# Patient Record
Sex: Female | Born: 1950
Health system: Southern US, Community
[De-identification: ages and names within clinical notes are randomized; demographics above are authoritative.]

## PROBLEM LIST (undated history)

## (undated) DIAGNOSIS — E119 Type 2 diabetes mellitus without complications: Secondary | ICD-10-CM

## (undated) DIAGNOSIS — I10 Essential (primary) hypertension: Secondary | ICD-10-CM

## (undated) HISTORY — PX: SKIN CANCER EXCISION: SHX779

## (undated) HISTORY — PX: HERNIA REPAIR: SHX51

## (undated) HISTORY — PX: ABDOMINAL HYSTERECTOMY: SHX81

## (undated) HISTORY — PX: BACK SURGERY: SHX140

## (undated) HISTORY — PX: JOINT REPLACEMENT: SHX530

---

## 1998-04-04 ENCOUNTER — Other Ambulatory Visit: Admission: RE | Admit: 1998-04-04 | Discharge: 1998-04-04 | Payer: Self-pay | Admitting: Gynecology

## 1998-05-07 ENCOUNTER — Other Ambulatory Visit: Admission: RE | Admit: 1998-05-07 | Discharge: 1998-05-07 | Payer: Self-pay | Admitting: Gynecology

## 1998-09-11 ENCOUNTER — Inpatient Hospital Stay (HOSPITAL_COMMUNITY): Admission: EM | Admit: 1998-09-11 | Discharge: 1998-09-12 | Payer: Self-pay | Admitting: Emergency Medicine

## 1998-09-12 ENCOUNTER — Encounter: Payer: Self-pay | Admitting: Internal Medicine

## 1999-11-25 ENCOUNTER — Encounter: Payer: Self-pay | Admitting: Orthopedic Surgery

## 1999-11-25 ENCOUNTER — Encounter (INDEPENDENT_AMBULATORY_CARE_PROVIDER_SITE_OTHER): Payer: Self-pay | Admitting: Specialist

## 1999-11-25 ENCOUNTER — Observation Stay (HOSPITAL_COMMUNITY): Admission: RE | Admit: 1999-11-25 | Discharge: 1999-11-26 | Payer: Self-pay | Admitting: Orthopedic Surgery

## 2000-05-06 ENCOUNTER — Ambulatory Visit (HOSPITAL_COMMUNITY): Admission: RE | Admit: 2000-05-06 | Discharge: 2000-05-06 | Payer: Self-pay | Admitting: Gastroenterology

## 2002-09-12 ENCOUNTER — Encounter: Payer: Self-pay | Admitting: Emergency Medicine

## 2002-09-12 ENCOUNTER — Inpatient Hospital Stay (HOSPITAL_COMMUNITY): Admission: EM | Admit: 2002-09-12 | Discharge: 2002-09-14 | Payer: Self-pay | Admitting: Emergency Medicine

## 2002-10-05 ENCOUNTER — Encounter: Payer: Self-pay | Admitting: *Deleted

## 2002-10-05 ENCOUNTER — Ambulatory Visit (HOSPITAL_COMMUNITY): Admission: RE | Admit: 2002-10-05 | Discharge: 2002-10-05 | Payer: Self-pay | Admitting: *Deleted

## 2002-12-11 ENCOUNTER — Observation Stay (HOSPITAL_COMMUNITY): Admission: RE | Admit: 2002-12-11 | Discharge: 2002-12-12 | Payer: Self-pay | Admitting: General Surgery

## 2002-12-11 ENCOUNTER — Encounter: Payer: Self-pay | Admitting: General Surgery

## 2002-12-11 ENCOUNTER — Encounter (INDEPENDENT_AMBULATORY_CARE_PROVIDER_SITE_OTHER): Payer: Self-pay | Admitting: Specialist

## 2005-05-14 ENCOUNTER — Other Ambulatory Visit: Admission: RE | Admit: 2005-05-14 | Discharge: 2005-05-14 | Payer: Self-pay | Admitting: Obstetrics and Gynecology

## 2005-05-14 ENCOUNTER — Ambulatory Visit (HOSPITAL_COMMUNITY): Admission: RE | Admit: 2005-05-14 | Discharge: 2005-05-14 | Payer: Self-pay | Admitting: Obstetrics and Gynecology

## 2005-07-12 ENCOUNTER — Observation Stay (HOSPITAL_COMMUNITY): Admission: RE | Admit: 2005-07-12 | Discharge: 2005-07-14 | Payer: Self-pay | Admitting: General Surgery

## 2005-11-16 ENCOUNTER — Encounter: Admission: RE | Admit: 2005-11-16 | Discharge: 2005-11-16 | Payer: Self-pay | Admitting: Family Medicine

## 2005-11-19 ENCOUNTER — Encounter: Admission: RE | Admit: 2005-11-19 | Discharge: 2005-11-19 | Payer: Self-pay | Admitting: General Surgery

## 2010-05-24 ENCOUNTER — Ambulatory Visit (HOSPITAL_COMMUNITY): Admission: RE | Admit: 2010-05-24 | Discharge: 2010-05-24 | Payer: Self-pay | Admitting: Emergency Medicine

## 2010-07-08 ENCOUNTER — Encounter: Admission: RE | Admit: 2010-07-08 | Discharge: 2010-07-08 | Payer: Self-pay

## 2011-01-12 ENCOUNTER — Inpatient Hospital Stay (HOSPITAL_COMMUNITY)
Admission: EM | Admit: 2011-01-12 | Discharge: 2011-01-13 | Payer: Self-pay | Source: Home / Self Care | Attending: Internal Medicine | Admitting: Internal Medicine

## 2011-01-13 LAB — DIFFERENTIAL
Basophils Absolute: 0 10*3/uL (ref 0.0–0.1)
Basophils Relative: 0 % (ref 0–1)
Eosinophils Absolute: 0.2 10*3/uL (ref 0.0–0.7)
Eosinophils Relative: 3 % (ref 0–5)
Lymphocytes Relative: 31 % (ref 12–46)
Lymphs Abs: 2.3 10*3/uL (ref 0.7–4.0)
Monocytes Absolute: 0.6 10*3/uL (ref 0.1–1.0)
Monocytes Relative: 8 % (ref 3–12)
Neutro Abs: 4.3 10*3/uL (ref 1.7–7.7)
Neutrophils Relative %: 58 % (ref 43–77)

## 2011-01-13 LAB — COMPREHENSIVE METABOLIC PANEL
ALT: 21 U/L (ref 0–35)
ALT: 17 U/L (ref 0–35)
AST: 19 U/L (ref 0–37)
AST: 19 U/L (ref 0–37)
Albumin: 3.5 g/dL (ref 3.5–5.2)
Albumin: 3.1 g/dL — ABNORMAL LOW (ref 3.5–5.2)
Alkaline Phosphatase: 67 U/L (ref 39–117)
Alkaline Phosphatase: 59 U/L (ref 39–117)
BUN: 12 mg/dL (ref 6–23)
BUN: 12 mg/dL (ref 6–23)
CO2: 26 mEq/L (ref 19–32)
CO2: 25 mEq/L (ref 19–32)
Calcium: 9.3 mg/dL (ref 8.4–10.5)
Calcium: 8.5 mg/dL (ref 8.4–10.5)
Chloride: 105 mEq/L (ref 96–112)
Chloride: 106 mEq/L (ref 96–112)
Creatinine, Ser: 0.77 mg/dL (ref 0.4–1.2)
Creatinine, Ser: 0.84 mg/dL (ref 0.4–1.2)
GFR calc Af Amer: >60 mL/min (ref >60)
GFR calc Af Amer: >60 mL/min (ref >60)
GFR calc non Af Amer: >60 mL/min (ref >60)
GFR calc non Af Amer: >60 mL/min (ref >60)
Glucose, Bld: 125 mg/dL — ABNORMAL HIGH (ref 70–99)
Glucose, Bld: 147 mg/dL — ABNORMAL HIGH (ref 70–99)
Potassium: 3.9 mEq/L (ref 3.5–5.1)
Potassium: 3.9 mEq/L (ref 3.5–5.1)
Sodium: 141 mEq/L (ref 135–145)
Sodium: 140 mEq/L (ref 135–145)
Total Bilirubin: 0.5 mg/dL (ref 0.3–1.2)
Total Bilirubin: 0.4 mg/dL (ref 0.3–1.2)
Total Protein: 6.3 g/dL (ref 6.0–8.3)
Total Protein: 5.4 g/dL — ABNORMAL LOW (ref 6.0–8.3)

## 2011-01-13 LAB — LIPID PANEL
Cholesterol: 183 mg/dL (ref 0–200)
HDL: 40 mg/dL (ref >39)
LDL Cholesterol: 98 mg/dL (ref 0–99)
Total CHOL/HDL Ratio: 4.6 RATIO
Triglycerides: 225 mg/dL — ABNORMAL HIGH (ref <150)
VLDL: 45 mg/dL — ABNORMAL HIGH (ref 0–40)

## 2011-01-13 LAB — PROTIME-INR
INR: 0.91 (ref 0.00–1.49)
INR: 1 (ref 0.00–1.49)
Prothrombin Time: 12.5 seconds (ref 11.6–15.2)
Prothrombin Time: 13.4 seconds (ref 11.6–15.2)

## 2011-01-13 LAB — URINE CULTURE
Colony Count: 10000
Culture  Setup Time: 201201241955

## 2011-01-13 LAB — POCT CARDIAC MARKERS
CKMB, poc: 1.2 ng/mL (ref 1.0–8.0)
CKMB, poc: 1.3 ng/mL (ref 1.0–8.0)
Myoglobin, poc: 58.8 ng/mL (ref 12–200)
Myoglobin, poc: 59.8 ng/mL (ref 12–200)
Troponin i, poc: <0.05 ng/mL (ref 0.00–0.09)
Troponin i, poc: <0.05 ng/mL (ref 0.00–0.09)

## 2011-01-13 LAB — URINE MICROSCOPIC-ADD ON

## 2011-01-13 LAB — BRAIN NATRIURETIC PEPTIDE: Pro B Natriuretic peptide (BNP): <30 pg/mL (ref 0.0–100.0)

## 2011-01-13 LAB — CBC
HCT: 34.5 % — ABNORMAL LOW (ref 36.0–46.0)
HCT: 30.8 % — ABNORMAL LOW (ref 36.0–46.0)
Hemoglobin: 11.5 g/dL — ABNORMAL LOW (ref 12.0–15.0)
Hemoglobin: 10.2 g/dL — ABNORMAL LOW (ref 12.0–15.0)
MCH: 28.4 pg (ref 26.0–34.0)
MCH: 28.7 pg (ref 26.0–34.0)
MCHC: 33.3 g/dL (ref 30.0–36.0)
MCHC: 33.1 g/dL (ref 30.0–36.0)
MCV: 85.2 fL (ref 78.0–100.0)
MCV: 86.5 fL (ref 78.0–100.0)
Platelets: 304 10*3/uL (ref 150–400)
Platelets: 264 10*3/uL (ref 150–400)
RBC: 4.05 MIL/uL (ref 3.87–5.11)
RBC: 3.56 MIL/uL — ABNORMAL LOW (ref 3.87–5.11)
RDW: 13 % (ref 11.5–15.5)
RDW: 13.2 % (ref 11.5–15.5)
WBC: 7.4 10*3/uL (ref 4.0–10.5)
WBC: 7.4 10*3/uL (ref 4.0–10.5)

## 2011-01-13 LAB — MRSA PCR SCREENING: MRSA by PCR: NEGATIVE

## 2011-01-13 LAB — MAGNESIUM: Magnesium: 1.8 mg/dL (ref 1.5–2.5)

## 2011-01-13 LAB — URINALYSIS, ROUTINE W REFLEX MICROSCOPIC
Bilirubin Urine: NEGATIVE
Hgb urine dipstick: NEGATIVE
Ketones, ur: NEGATIVE mg/dL
Nitrite: NEGATIVE
Protein, ur: NEGATIVE mg/dL
Specific Gravity, Urine: 1.018 (ref 1.005–1.030)
Urine Glucose, Fasting: 100 mg/dL — AB
Urobilinogen, UA: 0.2 mg/dL (ref 0.0–1.0)
pH: 5.5 (ref 5.0–8.0)

## 2011-01-13 LAB — TSH: TSH: 1.79 u[IU]/mL (ref 0.350–4.500)

## 2011-01-13 LAB — HEMOGLOBIN A1C
Hgb A1c MFr Bld: 7.3 % — ABNORMAL HIGH (ref <5.7)
Mean Plasma Glucose: 163 mg/dL — ABNORMAL HIGH (ref <117)

## 2011-01-13 LAB — LIPASE, BLOOD: Lipase: 28 U/L (ref 11–59)

## 2011-01-13 LAB — APTT: aPTT: 27 seconds (ref 24–37)

## 2011-01-13 LAB — HEPARIN LEVEL (UNFRACTIONATED)
Heparin Unfractionated: 0.15 IU/mL — ABNORMAL LOW (ref 0.30–0.70)
Heparin Unfractionated: 0.34 IU/mL (ref 0.30–0.70)

## 2011-01-18 NOTE — Discharge Summary (Addendum)
[name], [name]               ACCOUNT NO.:  [id number]  MEDICAL RECORD NO.:  [id number]          PATIENT TYPE:  INP  LOCATION:  6522                         FACILITY:  [facility]  PHYSICIAN:  [place] Evadene Wardrip, MD         DATE OF BIRTH:  [birth year]  DATE OF ADMISSION:  01/12/2011 DATE OF DISCHARGE:  01/13/2011                              DISCHARGE SUMMARY   DISCHARGE DIAGNOSIS: 1. Chest pain, noncardiac. 2. Hypertension, controlled. 3. Metabolic syndrome/diabetes mellitus type 2. 4. Obesity. 5. Dyslipidemia, started on Zocor. 6. Nonobstructive coronary artery disease.  DISCHARGE MEDICATIONS:  See medication reconciliation sheet though we did add Protonix and Zocor.  DISCHARGE INSTRUCTIONS: 1. Increase activity slowly.  May shower on January 14, 2011.  No     lifting for 2 days.  No driving for 2 days. 2. Low-sodium heart-healthy diabetic diet. 3. Follow radial cath instruction sheet for wound care. 4. Follow up with Dr. Lynnea Ferrier on February 12, 2011, at 1:30 p.m. 5. Follow up with primary care provider.  HOSPITAL COURSE:  A 60 year old white female developed pain in the middle of her chest at work on January 12, 2011.  She had previously had normal heart cath in 2003, presented to [facility] Emergency Room on January 12, 2011, with 10/10 lower midsternal chest discomfort.  She states the pain began at work, she began having a squeezing shooting pain with radiation to the left arm and subsequent breathlessness.  Pain was relieved with oxygen, aspirin, and nitro provided by EMS.  In the ER, pain was 2/10 on a 1-10 scale.  This was the most severe episode that she has had.  She also reported two-pillow orthopnea.  PAST MEDICAL HISTORY: 1. Hypertension, treated with lisinopril/hydrochlorothiazide. 2. She has a history of obesity. 3. Renal artery stenosis. 4. History of uterine cancer with hysterectomy. 5. Hyperlipidemia. 6. Gastroesophageal reflux disease. 7. History of  rheumatic fever in childhood. 8. Lap chole in the past.  She was admitted and plans were for cardiac catheterization today.  She was put on IV heparin, IV nitro, underwent cardiac catheterization, she had 30% RCA stenosis, otherwise patent course.  We did find her glucose was elevated and glycohemoglobin was actually 7.3.  She had actually discussed diabetes with her primary provider that were discussed as soon as they had a moment and we discussed that here she will cut white carbs and begin exercising.  She also probably has obstructive sleep apnea and would benefit from sleep study, which will be discussed as an outpatient with the patient. Her cardiac cath was done from the right radial artery.  She was also found to have a right renal stenosis of 30%.  She did well and will be ready for discharge when she recovers from the cardiac catheterization.  LABORATORY DATA:  Lipid panel; total cholesterol 183, triglycerides 225, HDL 40, and LDL 98.  Sodium 140, potassium 3.9, chloride 106, CO2 of 25, glucose 147, BUN 12, creatinine 0.84, total bili 0.4, alkaline phos 59, SGOT 19, SGPT 17, total protein 5.4, calcium 8.5.  Hemoglobin 10.2, hematocrit 30.8, WBC 7.4, and platelets 264.  Hemoglobin  A1c 7.3.  TSH 1.790.  Magnesium 1.8.  Cardiac markers were negative with CK-MB 1.3 and troponin less than 0.05 x2.  Lipase was 28.  BNP was less than 30.  UA was clear with trace leukocytes.  RADIOLOGY:  Chest x-ray, no active disease.  The patient underwent cath. Please see dictated cath report.  Was stable and ready for discharge home.     Darcella Gasman. Annie Paras, N.P.   ______________________________ [place] Aswad Wandrey, MD    LRI/MEDQ  D:  01/13/2011  T:  01/14/2011  Job:  [id number]  cc:   Ritta Slot, MD Leeroy Bock, PA  Electronically Signed by Nada Boozer N.P. on 01/15/2011 07:46:48 PM Electronically Signed by Kirtland Bouchard. Felisha Claytor M.D. on 01/18/2011 12:45:46 PM

## 2011-01-22 NOTE — Procedures (Signed)
[name], [name]               ACCOUNT NO.:  [id number]  MEDICAL RECORD NO.:  [id number]           PATIENT TYPE:  LOCATION:                                 FACILITY:  PHYSICIAN:  Tery Sanfilippo, MD     DATE OF BIRTH:  [birth year]  DATE OF PROCEDURE:  01/13/2011 DATE OF DISCHARGE:                           CARDIAC CATHETERIZATION   LEFT HEART CATHETERIZATION  PROCEDURES PERFORMED: 1. Left coronary angiography. 2. Left ventriculography. 3. Nonselective abdominal aortography looking for renal artery     stenosis.  PATIENT PROFILE:  A very pleasant 60 year old Caucasian female, who comes in with atypical chest pain with risk factors of hypertension, obesity, prior angina, negative cardiac cath in 2003, renal artery stenosis in 2003, who is, otherwise, coming in with a very atypical chest pain.  Her ECG is normal, and her enzymes are negative.  It was, therefore, decided to come to the cardiac catheterization laboratory for further evaluation.  After informed consent, the patient was placed in supine position.  The right wrist was prepped and draped in usual sterile fashion.  Using the modified Seldinger, the right radial artery was accessed with a 5-French sheath.  She received 3 mg of Versed and 75 mcg of fentanyl for IV light sedation.  A 2 mL of 1% lidocaine was used for local anesthetic.  The JL- 3.5, 5-French catheter was used to engage the left coronary artery circulation.  Visualization of the left coronary circulation was obtained in the AP cranial, LAO cranial, AP caudal views.  The JL-3.5 catheter was exchanged over an exchange wire for JR-4 catheter.  The JR- 4 catheter was used to engage into the right coronary artery without difficulty.  Visualization of the RCA was obtained in RAO cranial, LAO cranial views.  The JR-4 catheter was exchanged over a J exchange wire for the angled pigtail.  The angled pigtail was placed in the LV without difficulty.  Visualization  of the LV cavity was obtained using 30 mL of contrast at 10 mL per second in the RAO oblique view.  Angled pigtail was then used to nonselectively visualize the renal arteries with 20 mL of contrast at 20 mL/second.  FINDINGS: 1. AO pressure 104/64, mean of 82.  LV pressure 128/10, LVEDP of 18.     LV pullback 155/16, LVEDP of 20.  AO pullback 142/80, mean of 108. 2. Coronaries.  Left main coronary was free of any disease, bifurcated     into an LAD and circumflex.  There is no significant disease in     either of the circumflex territories or the LAD territory.  The     right coronary artery had a small 30% proximal to mid stenosis that     was really no significant.  It was a dominant vessel.  LV function     was found to be 65% on left ventriculography.  No regional wall     motion abnormality was noted.  No mitral regurgitation was noted.     In the renal artery, there was found to be a 30% proximal right     renal artery stenosis  but no flow-limiting lesions in the left     renal artery.  FINAL CONCLUSIONS:  A 30% mid right coronary artery lesion, normal left ventricular function, 30% proximal right renal artery stenosis for medical therapy.  PLAN:  Discharge home today.  Follow up with [facility] in a month's time.     Tery Sanfilippo, MD     HS/MEDQ  D:  01/13/2011  T:  01/14/2011  Job:  [id number]  Electronically Signed by Tery Sanfilippo MD on 01/22/2011 04:45:51 PM

## 2011-01-25 ENCOUNTER — Other Ambulatory Visit: Payer: Self-pay | Admitting: Emergency Medicine

## 2011-01-25 ENCOUNTER — Ambulatory Visit
Admission: RE | Admit: 2011-01-25 | Discharge: 2011-01-25 | Disposition: A | Discharge status: Home / Self Care | Payer: BC Managed Care – PPO | Source: Ambulatory Visit | Attending: Emergency Medicine | Admitting: Emergency Medicine

## 2011-01-25 DIAGNOSIS — M79662 Pain in left lower leg: Secondary | ICD-10-CM

## 2011-02-03 ENCOUNTER — Ambulatory Visit
Admission: RE | Admit: 2011-02-03 | Discharge: 2011-02-03 | Disposition: A | Discharge status: Home / Self Care | Payer: BC Managed Care – PPO | Source: Ambulatory Visit | Attending: Emergency Medicine | Admitting: Emergency Medicine

## 2011-02-03 ENCOUNTER — Other Ambulatory Visit: Payer: Self-pay | Admitting: Emergency Medicine

## 2011-02-03 DIAGNOSIS — R52 Pain, unspecified: Secondary | ICD-10-CM

## 2011-05-07 NOTE — Procedures (Signed)
[facility]  Patient:    [name], [name]                      MRN: [id number] Proc. Date: 05/06/01 Adm. Date:  [id number] Disc. Date: [id number] Attending:  Orland Mustard CC:         Jethro Bastos, M.D.                           Procedure Report  PROCEDURE:  Colonoscopy.  MEDICATIONS:  Fentanyl 62.5 mcg, Versed 6 mg IV.  INDICATION:  Rectal bleeding in a woman with a strong family history of colon cancer.  Her father died of colon cancer, and brother recently has been diagnosed withcolon cancer.  INSTRUMENT:  Olympus adult video colonoscope.  DESCRIPTION OF PROCEDURE:  The procedure had been explained to the patient and consent obtained.  With the patient in the left lateral decubitus position, the Olympus adult video colonoscope inserted following digital rectal exam and advanced under direct visualization.  The prep was quite good. We were able to advance to the cecum using abdominal pressure and position changes.  The right lower quadrant was transilluminated.  The appendiceal orifice and ileocecal valve were seen.  The scope was withdrawn and the cecum, ascending colon, hepatic flexure, transverse colon, splenic flexure, descending and sigmoid colon were seen well.  There was no significant diverticular disease. No polyps were seen.  The mucosa was completely normal throughout.  The scope was withdrawn down to the rectum.  Internal hemorrhoids were seen in the rectum. The patient tolerated the procedure well, maintained on low-flow oxygen and pulse oximeter throughout the procedure with no obvious problem.  ASSESSMENT: 1. Rectal bleedingprobably due to hemorrhoids. 2. No evidence of polyps, but the patient ishigh risk due to a strong    family history of colon cancer.  PLAN:  Will recommend repeating the procedure in five years due to her family history. DD:  05/06/00 TD:  05/11/00 Job: [id number]

## 2011-05-07 NOTE — Op Note (Signed)
[name], [name]                         ACCOUNT NO.:  [id number]   MEDICAL RECORD NO.:  [id number]                   PATIENT TYPE:  OBV   LOCATION:  [id number]                                 [facility]ACILITY:  [facility]   PHYSICIAN:  Sammuel Hines. Daiva Nakayama, M.D.              DATE OF BIRTH:  27-Nov-1951   DATE OF PROCEDURE:  12/11/2002  DATE OF DISCHARGE:  12/12/2002                                 OPERATIVE REPORT   PREOPERATIVE DIAGNOSIS:  Biliary dyskinesia.   POSTOPERATIVE DIAGNOSIS:  Biliary dyskinesia.   PROCEDURE:  Laparoscopic cholecystectomy with intraoperative cholangiogram.   SURGEON:  Sammuel Hines. Marlou Starks, M.D.   ASSISTANT:  Haywood Lasso, M.D.   ANESTHESIA:  General endotracheal.   DESCRIPTION OF PROCEDURE:  After informed consent was obtained, the patient  was brought to the operating room and placed in the supine position on the  operating room table.  After adequate induction of general anesthesia, the  patient's abdomen was prepped with Betadine and draped in the usual sterile  manner.  The area below the umbilicus was infiltrated with 0.25% Marcaine.  A small incision was made with a 15 blade knife.  This incision was carried  down through the subcutaneous tissue bluntly using [employer] retractors and  the Kelly clamp until the linea alba was identified.  The linea alba was  then incised with a 15 blade knife and each side was grasped with Kocher  clamps and elevated anteriorly.  The preperitoneal space was probed bluntly  with a hemostat until the peritoneum was opened and access was gained to the  abdominal cavity.  A 0 Vicryl pursestring stitch was placed in the fascia  surrounding this opening.  A Hasson cannula was placed through this opening  and anchored in place with the previously-placed Vicryl pursestring stitch.  The abdomen was then insufflated with carbon dioxide without difficulty.  The laparoscope was then placed through the Hasson cannula and the Hasson  was  positioned below the omentum.  The Hasson was then manipulated under  direct vision until a window through the omentum was identified and access  was gained to the free right upper quadrant.  Next after infiltrating the  area with 0.25% Marcaine, a small upper midline transverse incision was made  with a 15 blade knife.  A 10 mm port was then placed bluntly through this  incision into the abdominal cavity under direct vision.  Sites were then  chosen laterally on the right side of the abdomen for placement of the 5 mm  ports, and each of these areas was infiltrated with 0.25% Marcaine.  Small  stab incisions were made with a 15 blade knife.  The 5 mm ports were then  placed bluntly through this incision into the abdominal cavity under direct  vision.  A blunt grasper was placed through the lateralmost 5 mm port and  used to grasp the dome of the  gallbladder and elevate it anteriorly and  superiorly.  Another blunt grasper was passed through the other 5 mm port  and used to retract on the body and neck of the gallbladder.  A dissector  was placed through the upper midline port and using the electrocautery, the  peritoneal reflection of the gallbladder neck was opened.  Blunt dissection  was then carried out into this area until the gallbladder neck-cystic duct  junction was readily identified and a good window was created.  One clip was  placed on the gallbladder neck and a small ductotomy was made with the  laparoscopic scissors.  A 12-gauge Angiocath was then placed percutaneously  through the anterior abdominal wall and a Reddick catheter was placed  through the Angiocath.  The Reddick catheter was placed within the cystic  duct and anchored in place with the clip.  A cholangiogram was then obtained  that showed good filling of the biliary system and no filling defects, with  rapid emptying into the duodenum.  The anchoring clip and catheters were  then removed from the patient.  Three  clips were placed proximally on the  cystic duct and the duct was divided between the two cystic clips.  Before  this was done, the cystic duct was identified and it was actually in an  anterior position.  Three clips were placed proximally in the artery and one  distally and the artery was divided between the two.  Once this was all  complete, a laparoscopic hook cautery device was used to separate the  gallbladder from the liver bed.  Prior to completely detaching the  gallbladder from the liver bed, the liver bed was inspected and several  small bleeding points were coagulated with the electrocautery.  The  gallbladder was then detached the rest of the way from the liver bed with  the electrocautery.  The liver bed was inspected and found to be hemostatic.  The abdomen was then irrigated with copious amounts of saline until the  effluent was clear.  The laparoscope was then moved to the upper midline  port and a gallbladder grasper was placed through the Hasson cannula and  used to grasp the neck of the gallbladder.  The gallbladder was then removed  with the Hasson cannula through the infraumbilical port without difficulty.  The fascial defect was then closed with the previously-placed Vicryl  pursestring stitch.  The rest of the ports were removed under direct vision  and were all hemostatic.  The gas was allowed to escape.  The incisions were  all closed with interrupted 4-0 Monocryl subcuticular stitches, Benzoin and  Steri-Strips were applied.  The patient tolerated the procedure well.  At  the end of the case, all needle, sponge, and instrument counts were correct.  The patient was then awakened and taken to the recovery room in stable  condition.                                               Sammuel Hines. Daiva Nakayama, M.D.    PST/MEDQ  D:  12/11/2002  T:  12/12/2002  Job:  [id number]

## 2011-05-07 NOTE — Cardiovascular Report (Signed)
[name], [name]                         ACCOUNT NO.:  [id number]   MEDICAL RECORD NO.:  [id number]                   PATIENT TYPE:  INP   LOCATION:  0372                                 FACILITY:  [facility]   PHYSICIAN:  Richard A. Alanda Amass, M.D.          DATE OF BIRTH:  1951/09/08   DATE OF PROCEDURE:  09/13/2002  DATE OF DISCHARGE:                              CARDIAC CATHETERIZATION   PROCEDURE:  Retrograde central aortic catheterization, selective coronary  angiography by Judkins technique, left ventricular angiogram by RAO, LAO  projections, abdominal aortic angiogram mid stream PA projection.   DESCRIPTION OF PROCEDURE:  The patient reports from [facility] telemetry unit to [facility], second floor CP Lab in the postabsorptive  state after 5 mg of Valium p.o. premedication.  Then 1% Xylocaine was used  for local anesthesia.  Heparin was on hold on-call to the catheterization  lab. The patient was given 2 mg of Versed for sedation at the beginning of  the procedure IV.  The right groin was prepped, draped in the usual manner.  The CRFA was entered with a single anterior puncture using an 18 thin wall  needle and a 6 French short Daig side-arm sheath were inserted without  difficulty.  Catheterization was done with a 6 French 4 cm tapered preformed  coronary and pigtail catheters.  LV angiogram was done in the RAO and LAO  projection through a 6 [place] Cordis pigtail catheter at 25 cc 14 cc/sec. at  20 cc 12 cc/sec.  Pullback pressure through the CA was performed and showed  no gradient across the aortic valve.  Hand injection of the abdominal aorta  above the level of the renal arteries revealed some mild narrowing so  abdominal aortic angiogram was done in the mid stream PA projection at 25 cc  20 cc/sec. This demonstrated mild 30% narrowing of the left renal artery  that was smooth just beyond the ostia and appeared to be 30-40%.  It was  noncalcific. There  was tapering of the proximal right renal artery which was  superior to the left.  I believe there may have been some streaming in the  proximal third and there appeared to be about 30% segmental narrowing with  normal flow.  I did not think there was any angiographically significant  renal artery stenosis but this may need to be followed up with Dopplers  pending the patient's blood pressure.   The catheter was removed, side-arm sheath was flushed.  The patient was  transferred to the holding area for sheath removal and pressure hemostasis.  IV nitroglycerin was discontinued.  She tolerated the procedure well.   PRESSURES:  LV:  130/0, LVEDP 16 mmHg.  CA:  130/80 mmHg.  There is no  gradient across the aortic valve on catheter pullback.   Fluoroscopy did not reveal any intracardiac valvular or coronary  calcification.   The main left coronary  is normal.   The left anterior descending was widely patent and tortuous in the proximal  third but there was no significant narrowing or stenosis and was normal  throughout its course. It gave off a normal first diagonal from the proximal  third after SP-1.   The circumflex was a dominant vessel which gave off a moderately large first  marginal branch proximally that was normal. The circumflex proper then gave  off a large second marginal branch which trifurcated and a large third  marginal branch which were normal.  The PABG branch gave off the PDA and  PLA, which were widely patent and normal throughout its course.   The right coronary ostia was somewhat located anteriorly. It was a  nondominant vessel that was normal and gave off two small RV branches and a  conus branch proximally.   The patient is a 60 year old single, working, non smoking woman who has a  history of prior chest pain.  She has had remote normal catheterization  reportedly in 1991 and a negative treadmill for ischemia five years ago.  She has done well until recent  admission for substernal chest pain radiating  through the neck and shoulder and back. She claims to have had relief with  nitroglycerin in the emergency room and no recurrence of pain with IV  nitroglycerin in the hospital. Myocardial infarction is ruled out by serial  enzymes and ECGs and it was felt best to proceed with diagnostic  angiography.  She has associated exogenous obesity, systemic hypertension,  postmenopausal and lipid profile pending.  Fortunately, angiography reveals  normal coronary arteries and left ventricle with no evidence of spasm or  atherosclerotic narrowing.  She has some minor narrowing of her renal  arteries bilaterally.  Some of this may be due to streaming, but they are  not of hemodynamic significance.  She does have hypertension that is a  problem and she is a candidate for duplex noninvasive studies.  She has had  remote endometrial cancer with TAH and BSO in 1999, prior back, shoulder,  and knee surgery.  A brother with coronary disease.   I would recommend reassurance for this patient, empiric GI therapy.  Weight  reduction if possible may be helpful. She has had endoscopic evaluation in  the past with Dr. Randa Evens and if symptoms recur this may be considered  again.   CATHETERIZATION DIAGNOSES:  1. Chest pain, etiology not determined.  2. Probable gastroesophageal reflux disease.  3. A remote history of endometrial cancer with total abdominal hysterectomy     and bilateral salpingo-oophorectomy in 1999.  4. Prior back surgery.  5. Prior shoulder surgery.  6. Prior knee surgery.  7. Prior foot surgery.  8. Exogenous obesity.  9. Lipid status pending.                                                               Richard A. Alanda Amass, M.D.    RAW/MEDQ  D:  09/13/2002  T:  09/17/2002  Job:  [id number]   cc:   Thereasa Solo. Little, M.D.   Robert ____________, Judie Petit.D.

## 2011-05-07 NOTE — Op Note (Signed)
[name], BOHNSACK               ACCOUNT NO.:  [id number]   MEDICAL RECORD NO.:  [id number]          PATIENT TYPE:  OBV   LOCATION:  1416                         FACILITY:  [facility]   PHYSICIAN:  Ollen Gross. [name], M.D. DATE OF BIRTH:  1951/03/31   DATE OF PROCEDURE:  07/12/2005  DATE OF DISCHARGE:                                 OPERATIVE REPORT   PREOPERATIVE DIAGNOSIS:  Ventral hernia.   POSTOPERATIVE DIAGNOSIS:  Ventral hernia.   PROCEDURE:  Laparoscopic ventral hernia repair with mesh.   SURGEON:  Dr. Carolynne Edouard   ASSISTANT:  Dr. Derrell Lolling   ANESTHESIA:  General endotracheal.   PROCEDURE:  After informed consent was obtained, the patient was brought to  the operating room, placed in a supine position on the operating room table.  After adequate induction of general anesthesia, the patient's abdomen was  prepped with Betadine and draped in usual sterile manner including an Ioban  drape.  The epigastric region was then infiltrated with 25% Marcaine.  A  small incision was made with a 15 blade knife.  This incision was carried  down through the subcutaneous tissue bluntly with a hemostat and [employer]  retractors until the linea alba was identified.  The linea alba was incised  with a 15 blade knife.  Each side was grasped with Kocher clamps and  elevated anteriorly.  The preperitoneal space was then probed bluntly with  the hemostat until the peritoneum was opened and access was gained to the  abdominal cavity.  A 0 Vicryl pursestring stitch was placed on the fascia  surrounding the opening.  Hasson cannula was placed through the opening and  replaced the previously placed Vicryl pursestring stitch.  The abdomen was  then insufflated with carbon dioxide without difficulty.  Site was then  chosen on the left side of the abdomen for placement of a 5 mm port.  This  area was infiltrated with 25% Marcaine.  A small incision was made with a 15  blade knife, and a 5 mm port was placed  bluntly through this incision into  the abdominal cavity under direct vision.  There were some omental adhesions  to the midline of the abdominal wall.  These were taken down sharply with  the harmonic scalpel.  Once this was accomplished, the right lower quadrant  was able to be visualized, and a small defect with herniated omentum was  appreciated.  Using gentle traction on the herniated omentum, it was  gradually able to be reduced back into the intra-abdominal space.  The size  of the hernia defect was then estimated using a spinal needle, and  approximately 3-4 cm of overlap was then estimated circumferentially around  the defect, and the size of the mesh chosen was approximately 15 x 15 cm.  A  larger piece of proceed mesh was chosen and was cut to fit.  The mesh was  oriented according to letters placed on the abdominal wall.  Four #1 Novofil  stitches were then placed at the four corners of the mesh and tied.  The  mesh was then rolled like  a cigar and placed through the Hasson cannula into  the abdominal cavity.  The mesh was then unrolled and reoriented to match  the orientation on the abdominal wall.  Next, a small stab incision was made  at each of the four sites, corresponding to the Novofil stitches.  A suture  passer was placed through the abdominal wall at each of these spots and was  used to grasp the appropriate tail of the stitch and bring it through the  abdominal wall.  Once this was done at all four sites, each of the stitches  was tied down.  The mesh was nicely approximated to the anterior abdominal  wall.  Next, protractor was used to place tacks along the edge of the mesh  circumferentially so that there were no gaps in the mesh.  This was done in  two rows and once this was accomplished, all the stitches and tacks were  completely hemostatic, and there was a very good approximation of the mesh  to the abdominal wall with no gaps or folds.  At this point, in order  to get  all of the tacks around the mesh circumferentially, another 5 mm port was  placed on the right abdomen by infiltrating area with 0.25% Marcaine, making  a small stab incision with the 15 blade knife, and placing a 5 mm port  through the abdominal wall under direct vision into the abdominal cavity  and, at this point, the rest of the abdomen was inspected.  No other  abnormalities were noted.  The ports were then removed under direct vision.  The gas was allowed to escape.  The mesh was observed, continued to  approximate the abdominal wall nicely as the gas left the abdominal cavity.  and the fascial defect of the epigastric port was closed with the previously  placed Vicryl pursestring stitch.  Each of the skin incisions were then  closed with interrupted 4-0 Monocryl subcuticular stitches.  Benzoin and  Steri-Strips and sterile dressings were applied.  The patient tolerated the  procedure well.  At the end of the case, all needle, sponge, and instrument  counts were correct.  The patient was then awakened and taken to recovery in  stable condition.      Ollen Gross. [name], M.D.  Electronically Signed     PST/MEDQ  D:  07/12/2005  T:  07/12/2005  Job:  [id number]

## 2011-05-07 NOTE — Discharge Summary (Signed)
[name], [name]                           ACCOUNT NO.:  [id number]   MEDICAL RECORD NO.:  [id number]                  PATIENT TYPE:   LOCATION:                                       FACILITY:  [facility]   PHYSICIAN:  Thereasa Solo. Little, M.D.              DATE OF BIRTH:  [birth year]   DATE OF ADMISSION:  09/12/2002  DATE OF DISCHARGE:  09/14/2002                                 DISCHARGE SUMMARY   ADMISSION DIAGNOSES:  1. Chest pain rule out myocardial infarction.  2. Hypertension.  3. History of hyperlipidemia.  4. History of gastroesophageal reflux disease.   DISCHARGE DIAGNOSES:  1. Chest pain resolved.  Noncardiac.  2. Hypertension with 30% bilateral rapid atrial fibrillation.  3. Hyperlipidemia.  4. Gastroesophageal reflux disease.  5. Anterior chest wall muscle spasm.   PROCEDURE:  Cardiac catheterization.   COMPLICATIONS:  None.   CONDITION ON DISCHARGE:  Stable.   HISTORY OF PRESENT ILLNESS:  This is a 60 year old female who presented to  the emergency room on September 12, 2002, with onset of left anterior and  mid sternal chest pain that began on the morning of admission.  She had  associated radiation to the left arm and shoulder as well as left side of  the neck.  She also had nausea, generalized tiredness and fatigue.  No  diaphoresis, shortness of breath, nausea or vomiting.  She went on to work,  however, and chest pain persisted and she was advised by her supervisor at  work to go to the hospital for further evaluation.  Upon presentation to the  emergency room she was given three sublingual nitroglycerin which decreased  her chest pain.  She continued to experience, however, left shoulder and  left-sided neck pain along with some mild nausea  She said this was similar  to the two previous episodes that she had had of chest pain; however,  radiation into the shoulder and neck as well as the nausea were all  different from what she had had before.   PHYSICAL EXAMINATION ON ADMISSION:  GENERAL APPEARANCE:  Conscious, alert,  well oriented.  SKIN:  Warm and dry.  Normal color and temperature.  VITAL SIGNS:  Blood pressure 159/94, heart rate 72, respiratory rate 20,  temperature 97.9, O2 saturations were 100% on room air.  NECK:  No JVD, thyromegaly, cervical lymphadenopathy or bruit.  LUNGS:  Clear to auscultation bilaterally.  CARDIOVASCULAR:  Regular rate and rhythm.  Soft 1/6 systolic murmur at the  left sternal border.  ABDOMEN:  Obese, however, nondistended, normal bowel sounds in all  quadrants.  No hepatosplenomegaly.  She did have some epigastric tenderness  with palpation.  There was no abdominal bruit.  EXTREMITIES:  Not edematous.  There were no femoral bruits.  Pulses were +2  bilaterally.  NEUROLOGIC:  She had no focal deficits.   HOSPITAL COURSE:  The patient was admitted to  rule out MI with serial  cardiac enzymes and EKGs.  She was started on aspirin, Lovenox and a beta-  blocker.  She was on ACE inhibitor as an outpatient for hypertension.  Discussion as to possibility of discharge home if cardiac enzymes were  negative and perform outpatient Cardiolite study versus inpatient  catheterization.  The patient preferred cardiac catheterization for final  resolution.  PPI was also added for GI prophylaxis with her history of  reflux.  SLP will be checked since her lipid status is unknown.   As a result, Lovenox was discontinued and she was placed on IV heparin and  IV nitroglycerin with her continued complaints of left shoulder and left  neck pain along with some mild nausea.   The following day, she has complete resolution of her shoulder and neck  pain.  She has had no further chest pain.  Nausea had resolved.  Vital signs  were stable.  Repeat EKG had no further changes.  BMP was normal.  Lipid  profile showed a total of 203, triglycerides 156, HDL 54, LDL elevated at  118.  Cardiac enzymes were negative x3.  She is  scheduled for cardiac  catheterization later that morning.   On September 13, 2002, she was taken to the cardiac catheterization by  Richard A. Alanda Amass, M.D., who was covering in the absence of Dr. Clarene Duke.  She had normal coronary arteries with normal left ventricular function.  She  did have noted bilateral renal artery stenosis with approximately 30 to 40%  on the left and 30% on the right.   On September 14, 2002, she continued to be pain-free.  Vital signs are  stable.  She maintained a normal sinus rhythm on telemetry.  Physical  examination was unremarkable.  Catheterization site was without bruising or  hematoma.   She was discharged home on PPI.  She will continue ACE inhibitor and  hydrochlorothiazide for hypertension.  A long discussion as to the need for  lifestyle changes; i.e., proper diet and exercise.  We told her she would  get six months to make these changes to try and get her lipid levels down  and if there was no improvement after that time, she would most likely need  statin therapy.  She was also placed on Flexeril for a short period of time  secondary to muscle spasms in her back.   DISCHARGE MEDICATIONS:  1. Lisinopril 10 mg q.d.  2. Hydrochlorothiazide 25 mg q.d.  3. Aspirin 81 mg q.d.  4. Protonix 40 mg q.d.  5. Flexeril 10 mg one q.6h. p.r.n. muscle spasm.   ACTIVITY:  She was not to undertake any strenuous activity for the next two  days.  She was not to do any bending, stooping or lifting more than five to  10 pounds.  No sexual activity.  No driving x 1 day.   DIET:  She is to maintain a low salt, low fat, low cholesterol diet.   WOUND CARE:  She may shower.  We advised her not to soak in a bathtub or hot  tub or go swimming for two days.   DISCHARGE INSTRUCTIONS:  If she knows any pain, swelling, or bruising to her  catheterization site, she is to contact Dr. Clarene Duke.  FOLLOW UP:  She is to follow up with Dr. Clarene Duke in two to three weeks.   She  is to call for an appointment.    EKG showed a normal sinus rhythm with T-wave inversion  in the inferior  leads; i.e., lead III and aVF which were new since her last tracing in 1995.   Chest x-ray showed no active disease.   ADMISSION LABORATORY DATA:  CBC, BMP, D-dimer and cardiac enzymes were all  normal.       Adrian Saran, N.P.                        Thereasa Solo. Little, M.D.    HB/MEDQ  D:  11/22/2002  T:  11/22/2002  Job:  [id number]   cc:   Jethro Bastos, M.D.  [address]  [place]  [place] [address]  Fax: [phone]

## 2015-08-21 ENCOUNTER — Other Ambulatory Visit: Payer: Self-pay | Admitting: Internal Medicine

## 2015-08-22 ENCOUNTER — Other Ambulatory Visit: Payer: Self-pay

## 2015-08-22 DIAGNOSIS — Z1231 Encounter for screening mammogram for malignant neoplasm of breast: Secondary | ICD-10-CM

## 2015-08-27 LAB — CYTOLOGY - PAP

## 2015-08-28 ENCOUNTER — Ambulatory Visit
Admission: RE | Admit: 2015-08-28 | Discharge: 2015-08-28 | Disposition: A | Discharge status: Home / Self Care | Payer: PPO | Source: Ambulatory Visit

## 2015-08-28 DIAGNOSIS — Z1231 Encounter for screening mammogram for malignant neoplasm of breast: Secondary | ICD-10-CM

## 2016-02-23 DIAGNOSIS — C44319 Basal cell carcinoma of skin of other parts of face: Secondary | ICD-10-CM | POA: Diagnosis not present

## 2016-02-23 DIAGNOSIS — C44311 Basal cell carcinoma of skin of nose: Secondary | ICD-10-CM | POA: Diagnosis not present

## 2016-02-23 DIAGNOSIS — L859 Epidermal thickening, unspecified: Secondary | ICD-10-CM | POA: Diagnosis not present

## 2016-02-23 DIAGNOSIS — D485 Neoplasm of uncertain behavior of skin: Secondary | ICD-10-CM | POA: Diagnosis not present

## 2016-03-23 DIAGNOSIS — E1121 Type 2 diabetes mellitus with diabetic nephropathy: Secondary | ICD-10-CM | POA: Diagnosis not present

## 2016-03-23 DIAGNOSIS — I889 Nonspecific lymphadenitis, unspecified: Secondary | ICD-10-CM | POA: Diagnosis not present

## 2016-03-23 DIAGNOSIS — I10 Essential (primary) hypertension: Secondary | ICD-10-CM | POA: Diagnosis not present

## 2016-04-12 DIAGNOSIS — Z85820 Personal history of malignant melanoma of skin: Secondary | ICD-10-CM | POA: Diagnosis not present

## 2016-04-12 DIAGNOSIS — C44311 Basal cell carcinoma of skin of nose: Secondary | ICD-10-CM | POA: Diagnosis not present

## 2016-04-12 DIAGNOSIS — C44319 Basal cell carcinoma of skin of other parts of face: Secondary | ICD-10-CM | POA: Diagnosis not present

## 2016-05-13 DIAGNOSIS — Z85828 Personal history of other malignant neoplasm of skin: Secondary | ICD-10-CM | POA: Diagnosis not present

## 2016-05-13 DIAGNOSIS — D0471 Carcinoma in situ of skin of right lower limb, including hip: Secondary | ICD-10-CM | POA: Diagnosis not present

## 2016-05-13 DIAGNOSIS — L570 Actinic keratosis: Secondary | ICD-10-CM | POA: Diagnosis not present

## 2016-05-13 DIAGNOSIS — D225 Melanocytic nevi of trunk: Secondary | ICD-10-CM | POA: Diagnosis not present

## 2016-05-13 DIAGNOSIS — D0461 Carcinoma in situ of skin of right upper limb, including shoulder: Secondary | ICD-10-CM | POA: Diagnosis not present

## 2016-05-13 DIAGNOSIS — L821 Other seborrheic keratosis: Secondary | ICD-10-CM | POA: Diagnosis not present

## 2016-05-13 DIAGNOSIS — Z85820 Personal history of malignant melanoma of skin: Secondary | ICD-10-CM | POA: Diagnosis not present

## 2016-05-13 DIAGNOSIS — C4441 Basal cell carcinoma of skin of scalp and neck: Secondary | ICD-10-CM | POA: Diagnosis not present

## 2016-05-13 DIAGNOSIS — D485 Neoplasm of uncertain behavior of skin: Secondary | ICD-10-CM | POA: Diagnosis not present

## 2016-05-14 DIAGNOSIS — R300 Dysuria: Secondary | ICD-10-CM | POA: Diagnosis not present

## 2016-06-08 DIAGNOSIS — L570 Actinic keratosis: Secondary | ICD-10-CM | POA: Diagnosis not present

## 2016-06-21 DIAGNOSIS — Z85820 Personal history of malignant melanoma of skin: Secondary | ICD-10-CM | POA: Diagnosis not present

## 2016-06-21 DIAGNOSIS — L570 Actinic keratosis: Secondary | ICD-10-CM | POA: Diagnosis not present

## 2016-06-21 DIAGNOSIS — Z85828 Personal history of other malignant neoplasm of skin: Secondary | ICD-10-CM | POA: Diagnosis not present

## 2016-06-21 DIAGNOSIS — D0471 Carcinoma in situ of skin of right lower limb, including hip: Secondary | ICD-10-CM | POA: Diagnosis not present

## 2016-06-26 ENCOUNTER — Emergency Department (HOSPITAL_BASED_OUTPATIENT_CLINIC_OR_DEPARTMENT_OTHER)
Admission: EM | Admit: 2016-06-26 | Discharge: 2016-06-26 | Disposition: A | Discharge status: Home / Self Care | Payer: PPO | Attending: Emergency Medicine | Admitting: Emergency Medicine

## 2016-06-26 ENCOUNTER — Encounter (HOSPITAL_BASED_OUTPATIENT_CLINIC_OR_DEPARTMENT_OTHER): Payer: Self-pay | Admitting: *Deleted

## 2016-06-26 DIAGNOSIS — S0990XA Unspecified injury of head, initial encounter: Secondary | ICD-10-CM

## 2016-06-26 DIAGNOSIS — Z79899 Other long term (current) drug therapy: Secondary | ICD-10-CM | POA: Diagnosis not present

## 2016-06-26 DIAGNOSIS — Z85828 Personal history of other malignant neoplasm of skin: Secondary | ICD-10-CM | POA: Diagnosis not present

## 2016-06-26 DIAGNOSIS — W228XXA Striking against or struck by other objects, initial encounter: Secondary | ICD-10-CM | POA: Insufficient documentation

## 2016-06-26 DIAGNOSIS — S0101XA Laceration without foreign body of scalp, initial encounter: Secondary | ICD-10-CM | POA: Insufficient documentation

## 2016-06-26 DIAGNOSIS — Y999 Unspecified external cause status: Secondary | ICD-10-CM | POA: Diagnosis not present

## 2016-06-26 DIAGNOSIS — Z9660 Presence of unspecified orthopedic joint implant: Secondary | ICD-10-CM | POA: Insufficient documentation

## 2016-06-26 DIAGNOSIS — E119 Type 2 diabetes mellitus without complications: Secondary | ICD-10-CM | POA: Insufficient documentation

## 2016-06-26 DIAGNOSIS — Z7984 Long term (current) use of oral hypoglycemic drugs: Secondary | ICD-10-CM | POA: Diagnosis not present

## 2016-06-26 DIAGNOSIS — Y9389 Activity, other specified: Secondary | ICD-10-CM | POA: Insufficient documentation

## 2016-06-26 DIAGNOSIS — I10 Essential (primary) hypertension: Secondary | ICD-10-CM | POA: Diagnosis not present

## 2016-06-26 DIAGNOSIS — S098XXA Other specified injuries of head, initial encounter: Secondary | ICD-10-CM | POA: Diagnosis not present

## 2016-06-26 DIAGNOSIS — Y929 Unspecified place or not applicable: Secondary | ICD-10-CM | POA: Insufficient documentation

## 2016-06-26 HISTORY — DX: Essential (primary) hypertension: I10

## 2016-06-26 HISTORY — DX: Type 2 diabetes mellitus without complications: E11.9

## 2016-06-26 MED ORDER — ACETAMINOPHEN 325 MG PO TABS
650.0000 mg | ORAL_TABLET | Freq: Once | ORAL | Status: AC
  Administered 2016-06-26: 650 mg via ORAL
  Filled 2016-06-26: qty 2

## 2016-06-26 MED ORDER — LIDOCAINE-EPINEPHRINE 2 %-1:100000 IJ SOLN
20.0000 mL | Freq: Once | INTRAMUSCULAR | Status: AC
  Administered 2016-06-26: 20 mL
  Filled 2016-06-26: qty 1

## 2016-06-26 MED ORDER — TETANUS-DIPHTH-ACELL PERTUSSIS 5-2.5-18.5 LF-MCG/0.5 IM SUSP
0.5000 mL | Freq: Once | INTRAMUSCULAR | Status: AC
  Administered 2016-06-26: 0.5 mL via INTRAMUSCULAR
  Filled 2016-06-26: qty 0.5
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
## 2016-06-26 NOTE — ED Notes (Signed)
Pt given d/c instructions as per chart. Verbalizes understanding. No questions. 
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
## 2016-06-26 NOTE — Discharge Instructions (Signed)
Please read and follow all provided instructions.  Your diagnoses today include:  1. Scalp laceration, initial encounter   2. Minor head injury, initial encounter    Tests performed today include:  Vital signs. See below for your results today.   Medications prescribed:   None  Take any prescribed medications only as directed.  Home care instructions:  Follow any educational materials contained in this packet.  Follow-up with your doctor in 1 week for removal of staples in your scalp.  Return instructions:  SEEK IMMEDIATE MEDICAL ATTENTION IF:  There is confusion or drowsiness (although children frequently become drowsy after injury).   You cannot awaken the injured person.   You have more than one episode of vomiting.   You notice dizziness or unsteadiness which is getting worse, or inability to walk.   You have convulsions or unconsciousness.   You experience severe, persistent headaches not relieved by Tylenol.  You cannot use arms or legs normally.   There are changes in pupil sizes. (This is the black center in the colored part of the eye)   There is clear or bloody discharge from the nose or ears.   You have change in speech, vision, swallowing, or understanding.   Localized weakness, numbness, tingling, or change in bowel or bladder control.  You have any other emergent concerns.  Additional Information: You have had a head injury which does not appear to require admission at this time.  Your vital signs today were: BP 147/75 mmHg   Pulse 93   Resp 20   Ht 5\' 4"  (1.626 m)   Wt 84.823 kg   BMI 32.08 kg/m2   SpO2 100% If your blood pressure (BP) was elevated above 135/85 this visit, please have this repeated by your doctor within one month. --------------

## 2016-06-26 NOTE — ED Notes (Signed)
Provider at bedside to assess patient.

## 2016-07-05 DIAGNOSIS — Z4802 Encounter for removal of sutures: Secondary | ICD-10-CM | POA: Diagnosis not present

## 2016-08-04 DIAGNOSIS — I10 Essential (primary) hypertension: Secondary | ICD-10-CM | POA: Diagnosis not present

## 2016-08-04 DIAGNOSIS — E118 Type 2 diabetes mellitus with unspecified complications: Secondary | ICD-10-CM | POA: Diagnosis not present

## 2016-08-04 DIAGNOSIS — Z7984 Long term (current) use of oral hypoglycemic drugs: Secondary | ICD-10-CM | POA: Diagnosis not present

## 2016-08-25 DIAGNOSIS — E118 Type 2 diabetes mellitus with unspecified complications: Secondary | ICD-10-CM | POA: Diagnosis not present

## 2016-08-25 DIAGNOSIS — Z6832 Body mass index (BMI) 32.0-32.9, adult: Secondary | ICD-10-CM | POA: Diagnosis not present

## 2016-08-25 DIAGNOSIS — E669 Obesity, unspecified: Secondary | ICD-10-CM | POA: Diagnosis not present

## 2016-08-25 DIAGNOSIS — R635 Abnormal weight gain: Secondary | ICD-10-CM | POA: Diagnosis not present

## 2016-08-25 DIAGNOSIS — Z7984 Long term (current) use of oral hypoglycemic drugs: Secondary | ICD-10-CM | POA: Diagnosis not present

## 2016-08-25 DIAGNOSIS — R202 Paresthesia of skin: Secondary | ICD-10-CM | POA: Diagnosis not present

## 2016-08-25 DIAGNOSIS — E785 Hyperlipidemia, unspecified: Secondary | ICD-10-CM | POA: Diagnosis not present

## 2016-08-25 DIAGNOSIS — Z1211 Encounter for screening for malignant neoplasm of colon: Secondary | ICD-10-CM | POA: Diagnosis not present

## 2016-08-25 DIAGNOSIS — I10 Essential (primary) hypertension: Secondary | ICD-10-CM | POA: Diagnosis not present

## 2016-08-25 DIAGNOSIS — C439 Malignant melanoma of skin, unspecified: Secondary | ICD-10-CM | POA: Diagnosis not present

## 2016-08-25 DIAGNOSIS — Z1239 Encounter for other screening for malignant neoplasm of breast: Secondary | ICD-10-CM | POA: Diagnosis not present

## 2016-08-25 DIAGNOSIS — Z0000 Encounter for general adult medical examination without abnormal findings: Secondary | ICD-10-CM | POA: Diagnosis not present

## 2016-08-31 ENCOUNTER — Other Ambulatory Visit: Payer: Self-pay | Admitting: Internal Medicine

## 2016-08-31 DIAGNOSIS — Z1231 Encounter for screening mammogram for malignant neoplasm of breast: Secondary | ICD-10-CM

## 2016-09-10 ENCOUNTER — Ambulatory Visit
Admission: RE | Admit: 2016-09-10 | Discharge: 2016-09-10 | Disposition: A | Discharge status: Home / Self Care | Payer: PPO | Source: Ambulatory Visit | Attending: Internal Medicine | Admitting: Internal Medicine

## 2016-09-10 DIAGNOSIS — Z1231 Encounter for screening mammogram for malignant neoplasm of breast: Secondary | ICD-10-CM | POA: Diagnosis not present

## 2016-09-15 ENCOUNTER — Other Ambulatory Visit: Payer: Self-pay

## 2016-09-15 DIAGNOSIS — R928 Other abnormal and inconclusive findings on diagnostic imaging of breast: Secondary | ICD-10-CM

## 2016-09-20 ENCOUNTER — Ambulatory Visit
Admission: RE | Admit: 2016-09-20 | Discharge: 2016-09-20 | Disposition: A | Discharge status: Home / Self Care | Payer: PPO | Source: Ambulatory Visit

## 2016-09-20 DIAGNOSIS — R928 Other abnormal and inconclusive findings on diagnostic imaging of breast: Secondary | ICD-10-CM | POA: Diagnosis not present

## 2016-09-20 DIAGNOSIS — N6489 Other specified disorders of breast: Secondary | ICD-10-CM | POA: Diagnosis not present

## 2016-10-27 DIAGNOSIS — Z23 Encounter for immunization: Secondary | ICD-10-CM | POA: Diagnosis not present

## 2016-10-27 DIAGNOSIS — E669 Obesity, unspecified: Secondary | ICD-10-CM | POA: Diagnosis not present

## 2016-10-27 DIAGNOSIS — C4490 Unspecified malignant neoplasm of skin, unspecified: Secondary | ICD-10-CM | POA: Diagnosis not present

## 2016-10-27 DIAGNOSIS — Z7984 Long term (current) use of oral hypoglycemic drugs: Secondary | ICD-10-CM | POA: Diagnosis not present

## 2016-10-27 DIAGNOSIS — E118 Type 2 diabetes mellitus with unspecified complications: Secondary | ICD-10-CM | POA: Diagnosis not present

## 2016-10-27 DIAGNOSIS — Z6831 Body mass index (BMI) 31.0-31.9, adult: Secondary | ICD-10-CM | POA: Diagnosis not present

## 2016-11-04 DIAGNOSIS — C44722 Squamous cell carcinoma of skin of right lower limb, including hip: Secondary | ICD-10-CM | POA: Diagnosis not present

## 2016-11-04 DIAGNOSIS — L570 Actinic keratosis: Secondary | ICD-10-CM | POA: Diagnosis not present

## 2016-11-04 DIAGNOSIS — D485 Neoplasm of uncertain behavior of skin: Secondary | ICD-10-CM | POA: Diagnosis not present

## 2016-11-04 DIAGNOSIS — B078 Other viral warts: Secondary | ICD-10-CM | POA: Diagnosis not present

## 2016-11-04 DIAGNOSIS — D1801 Hemangioma of skin and subcutaneous tissue: Secondary | ICD-10-CM | POA: Diagnosis not present

## 2016-11-04 DIAGNOSIS — C44519 Basal cell carcinoma of skin of other part of trunk: Secondary | ICD-10-CM | POA: Diagnosis not present

## 2016-11-04 DIAGNOSIS — L821 Other seborrheic keratosis: Secondary | ICD-10-CM | POA: Diagnosis not present

## 2016-11-04 DIAGNOSIS — Z85820 Personal history of malignant melanoma of skin: Secondary | ICD-10-CM | POA: Diagnosis not present

## 2016-11-04 DIAGNOSIS — Z85828 Personal history of other malignant neoplasm of skin: Secondary | ICD-10-CM | POA: Diagnosis not present

## 2016-11-04 DIAGNOSIS — L814 Other melanin hyperpigmentation: Secondary | ICD-10-CM | POA: Diagnosis not present

## 2016-11-19 DIAGNOSIS — Z85820 Personal history of malignant melanoma of skin: Secondary | ICD-10-CM | POA: Diagnosis not present

## 2016-11-19 DIAGNOSIS — C44519 Basal cell carcinoma of skin of other part of trunk: Secondary | ICD-10-CM | POA: Diagnosis not present

## 2016-11-19 DIAGNOSIS — Z85828 Personal history of other malignant neoplasm of skin: Secondary | ICD-10-CM | POA: Diagnosis not present

## 2016-11-19 DIAGNOSIS — C44722 Squamous cell carcinoma of skin of right lower limb, including hip: Secondary | ICD-10-CM | POA: Diagnosis not present

## 2016-12-27 DIAGNOSIS — Z7984 Long term (current) use of oral hypoglycemic drugs: Secondary | ICD-10-CM | POA: Diagnosis not present

## 2016-12-27 DIAGNOSIS — E118 Type 2 diabetes mellitus with unspecified complications: Secondary | ICD-10-CM | POA: Diagnosis not present

## 2016-12-29 DIAGNOSIS — E118 Type 2 diabetes mellitus with unspecified complications: Secondary | ICD-10-CM | POA: Diagnosis not present

## 2017-01-27 ENCOUNTER — Emergency Department (HOSPITAL_COMMUNITY): Payer: PPO

## 2017-01-27 ENCOUNTER — Encounter (HOSPITAL_COMMUNITY): Payer: Self-pay | Admitting: *Deleted

## 2017-01-27 ENCOUNTER — Emergency Department (HOSPITAL_COMMUNITY)
Admission: EM | Admit: 2017-01-27 | Discharge: 2017-01-27 | Disposition: A | Discharge status: Home / Self Care | Payer: PPO | Attending: Emergency Medicine | Admitting: Emergency Medicine

## 2017-01-27 DIAGNOSIS — Z7984 Long term (current) use of oral hypoglycemic drugs: Secondary | ICD-10-CM | POA: Diagnosis not present

## 2017-01-27 DIAGNOSIS — E119 Type 2 diabetes mellitus without complications: Secondary | ICD-10-CM | POA: Diagnosis not present

## 2017-01-27 DIAGNOSIS — R109 Unspecified abdominal pain: Secondary | ICD-10-CM | POA: Diagnosis not present

## 2017-01-27 DIAGNOSIS — I10 Essential (primary) hypertension: Secondary | ICD-10-CM | POA: Diagnosis not present

## 2017-01-27 DIAGNOSIS — N132 Hydronephrosis with renal and ureteral calculous obstruction: Secondary | ICD-10-CM | POA: Insufficient documentation

## 2017-01-27 DIAGNOSIS — R1110 Vomiting, unspecified: Secondary | ICD-10-CM | POA: Diagnosis not present

## 2017-01-27 DIAGNOSIS — Z85828 Personal history of other malignant neoplasm of skin: Secondary | ICD-10-CM | POA: Diagnosis not present

## 2017-01-27 DIAGNOSIS — Z79899 Other long term (current) drug therapy: Secondary | ICD-10-CM | POA: Insufficient documentation

## 2017-01-27 LAB — CBC
HCT: 37.6 % (ref 36.0–46.0)
Hemoglobin: 12.4 g/dL (ref 12.0–15.0)
MCH: 28.7 pg (ref 26.0–34.0)
MCHC: 33 g/dL (ref 30.0–36.0)
MCV: 87 fL (ref 78.0–100.0)
Platelets: 312 10*3/uL (ref 150–400)
RBC: 4.32 MIL/uL (ref 3.87–5.11)
RDW: 12.7 % (ref 11.5–15.5)
WBC: 11.9 10*3/uL — ABNORMAL HIGH (ref 4.0–10.5)

## 2017-01-27 LAB — URINALYSIS, ROUTINE W REFLEX MICROSCOPIC
Bilirubin Urine: NEGATIVE
Glucose, UA: 50 mg/dL — AB
Ketones, ur: NEGATIVE mg/dL
Leukocytes, UA: NEGATIVE
Nitrite: NEGATIVE
Protein, ur: 30 mg/dL — AB
Specific Gravity, Urine: 1.02 (ref 1.005–1.030)
pH: 5 (ref 5.0–8.0)

## 2017-01-27 LAB — LIPASE, BLOOD: Lipase: 80 U/L — ABNORMAL HIGH (ref 11–51)

## 2017-01-27 LAB — COMPREHENSIVE METABOLIC PANEL
ALT: 20 U/L (ref 14–54)
AST: 18 U/L (ref 15–41)
Albumin: 3.8 g/dL (ref 3.5–5.0)
Alkaline Phosphatase: 60 U/L (ref 38–126)
Anion gap: 13 (ref 5–15)
BUN: 20 mg/dL (ref 6–20)
CO2: 25 mmol/L (ref 22–32)
Calcium: 9.1 mg/dL (ref 8.9–10.3)
Chloride: 102 mmol/L (ref 101–111)
Creatinine, Ser: 0.76 mg/dL (ref 0.44–1.00)
GFR calc Af Amer: >60 mL/min (ref >60)
GFR calc non Af Amer: >60 mL/min (ref >60)
Glucose, Bld: 196 mg/dL — ABNORMAL HIGH (ref 65–99)
Potassium: 3.7 mmol/L (ref 3.5–5.1)
Sodium: 140 mmol/L (ref 135–145)
Total Bilirubin: 0.4 mg/dL (ref 0.3–1.2)
Total Protein: 6.6 g/dL (ref 6.5–8.1)

## 2017-01-27 MED ORDER — ONDANSETRON 4 MG PO TBDP: 4.0000 mg | ORAL_TABLET | Freq: Once | ORAL | Status: DC | PRN

## 2017-01-27 MED ORDER — KETOROLAC TROMETHAMINE 15 MG/ML IJ SOLN
15.0000 mg | Freq: Once | INTRAMUSCULAR | Status: AC
  Administered 2017-01-27: 15 mg via INTRAVENOUS
  Filled 2017-01-27: qty 1

## 2017-01-27 MED ORDER — OXYCODONE-ACETAMINOPHEN 5-325 MG PO TABS: 1.0000 | ORAL_TABLET | Freq: Four times a day (QID) | ORAL | 0 refills | Status: DC | PRN

## 2017-01-27 MED ORDER — SODIUM CHLORIDE 0.9 % IV BOLUS (SEPSIS)
1000.0000 mL | Freq: Once | INTRAVENOUS | Status: AC
  Administered 2017-01-27: 1000 mL via INTRAVENOUS

## 2017-01-27 MED ORDER — IOPAMIDOL (ISOVUE-300) INJECTION 61%
INTRAVENOUS | Status: AC
  Administered 2017-01-27: 100 mL
  Filled 2017-01-27: qty 100

## 2017-01-27 MED ORDER — ONDANSETRON HCL 4 MG PO TABS: 4.0000 mg | ORAL_TABLET | Freq: Three times a day (TID) | ORAL | 0 refills | Status: DC | PRN

## 2017-01-27 MED ORDER — ONDANSETRON 4 MG PO TBDP
ORAL_TABLET | ORAL | Status: AC
  Administered 2017-01-27: 05:00:00
  Filled 2017-01-27: qty 1

## 2017-01-27 NOTE — ED Notes (Signed)
Patient unhooked to use restroom 

## 2017-01-27 NOTE — ED Triage Notes (Signed)
States she woke up at 2 am with abd. Pain had several loose stools and vomited x 2 c/o abd. pain

## 2017-01-27 NOTE — ED Notes (Signed)
States her nausea is a little better , states pain is still there.

## 2017-01-27 NOTE — ED Notes (Signed)
Papers reviewed with patient and IV removed

## 2017-01-27 NOTE — ED Notes (Signed)
Pt stated she used restroom and blood is in urine. RN notifed

## 2017-02-09 DIAGNOSIS — H61002 Unspecified perichondritis of left external ear: Secondary | ICD-10-CM | POA: Diagnosis not present

## 2017-02-09 DIAGNOSIS — D1801 Hemangioma of skin and subcutaneous tissue: Secondary | ICD-10-CM | POA: Diagnosis not present

## 2017-02-09 DIAGNOSIS — D485 Neoplasm of uncertain behavior of skin: Secondary | ICD-10-CM | POA: Diagnosis not present

## 2017-02-09 DIAGNOSIS — L821 Other seborrheic keratosis: Secondary | ICD-10-CM | POA: Diagnosis not present

## 2017-02-09 DIAGNOSIS — L814 Other melanin hyperpigmentation: Secondary | ICD-10-CM | POA: Diagnosis not present

## 2017-02-09 DIAGNOSIS — L570 Actinic keratosis: Secondary | ICD-10-CM | POA: Diagnosis not present

## 2017-02-09 DIAGNOSIS — C44622 Squamous cell carcinoma of skin of right upper limb, including shoulder: Secondary | ICD-10-CM | POA: Diagnosis not present

## 2017-02-09 DIAGNOSIS — Z85820 Personal history of malignant melanoma of skin: Secondary | ICD-10-CM | POA: Diagnosis not present

## 2017-02-09 DIAGNOSIS — Z85828 Personal history of other malignant neoplasm of skin: Secondary | ICD-10-CM | POA: Diagnosis not present

## 2017-02-12 NOTE — ED Provider Notes (Signed)
[facility] DEPT Provider Note   CSN: [id number] Arrival date & time: 01/27/17  0423     History   Chief Complaint Chief Complaint  Patient presents with  . Abdominal Pain  . Nausea  . Diarrhea    HPI [name] is a 66 y.o. female.  HPI   65yF with abdominal pain. Onset around 0200 today. Persistent since then. Diffuse, but worse on R. No appreciable exacerbating or relieving factors.  Associated n/v and several loose BMs. No blood in stool or emesis. No fever or chills. No urinary complaints.   Past Medical History:  Diagnosis Date  . Diabetes mellitus without complication ([place])   . Hypertension     There are no active problems to display for this patient.   Past Surgical History:  Procedure Laterality Date  . ABDOMINAL HYSTERECTOMY    . BACK SURGERY    . HERNIA REPAIR    . JOINT REPLACEMENT    . SKIN CANCER EXCISION     on the face and arms    OB History    No data available       Home Medications    Prior to Admission medications   Medication Sig Start Date End Date Taking? Authorizing Provider  cholecalciferol (VITAMIN D) 1000 units tablet Take 1,000 Units by mouth daily.   Yes Historical Provider, MD  lisinopril-hydrochlorothiazide (PRINZIDE,ZESTORETIC) 10-12.5 MG tablet Take 1 tablet by mouth daily.   Yes Historical Provider, MD  metFORMIN (GLUCOPHAGE) 1000 MG tablet Take 1,000 mg by mouth 2 (two) times daily with a meal.   Yes Historical Provider, MD  Multiple Vitamin (MULTIVITAMIN) tablet Take 1 tablet by mouth daily.   Yes Historical Provider, MD  Probiotic Product (PROBIOTIC PO) Take 1 tablet by mouth daily.   Yes Historical Provider, MD  sitaGLIPtin (JANUVIA) 100 MG tablet Take 100 mg by mouth daily.   Yes Historical Provider, MD  vitamin B-12 (CYANOCOBALAMIN) 100 MCG tablet Take 100 mcg by mouth daily.   Yes Historical Provider, MD  ondansetron (ZOFRAN) 4 MG tablet Take 1 tablet (4 mg total) by mouth every 8 (eight) hours as needed for  nausea or vomiting. 01/27/17   Virgel Manifold, MD  oxyCODONE-acetaminophen (PERCOCET/ROXICET) 5-325 MG tablet Take 1-2 tablets by mouth every 6 (six) hours as needed for severe pain. 01/27/17   Virgel Manifold, MD    Family History History reviewed. No pertinent family history.  Social History Social History  Substance Use Topics  . Smoking status: Never Smoker  . Smokeless tobacco: Never Used  . Alcohol use No     Allergies   Patient has no known allergies.   Review of Systems Review of Systems   All systems reviewed and negative, other than as noted in HPI.   Physical Exam Updated Vital Signs BP 152/77   Pulse 88   Temp 97.4 F (36.3 C) (Oral)   Resp 20   Ht '5\' 4"'$  (1.626 m)   Wt 180 lb (81.6 kg)   SpO2 98%   BMI 30.90 kg/m   Physical Exam  Constitutional: She appears well-developed and well-nourished. No distress.  HENT:  Head: Normocephalic and atraumatic.  Eyes: Conjunctivae are normal. Right eye exhibits no discharge. Left eye exhibits no discharge.  Neck: Neck supple.  Cardiovascular: Normal rate, regular rhythm and normal heart sounds.  Exam reveals no gallop and no friction rub.   No murmur heard. Pulmonary/Chest: Effort normal and breath sounds normal. No respiratory distress.  Abdominal: Soft. She exhibits  no distension and no mass. There is tenderness. There is no guarding.  R sided abdominal tenderness w/o rebound or guarding.   Musculoskeletal: She exhibits no edema or tenderness.  Neurological: She is alert.  Skin: Skin is warm and dry.  Psychiatric: She has a normal mood and affect. Her behavior is normal. Thought content normal.  Nursing note and vitals reviewed.    ED Treatments / Results  Labs (all labs ordered are listed, but only abnormal results are displayed) Labs Reviewed  LIPASE, BLOOD - Abnormal; Notable for the following:       Result Value   Lipase 80 (*)    All other components within normal limits  COMPREHENSIVE METABOLIC PANEL  - Abnormal; Notable for the following:    Glucose, Bld 196 (*)    All other components within normal limits  CBC - Abnormal; Notable for the following:    WBC 11.9 (*)    All other components within normal limits  URINALYSIS, ROUTINE W REFLEX MICROSCOPIC - Abnormal; Notable for the following:    Glucose, UA 50 (*)    Hgb urine dipstick LARGE (*)    Protein, ur 30 (*)    Bacteria, UA RARE (*)    Squamous Epithelial / LPF 0-5 (*)    All other components within normal limits    EKG  EKG Interpretation None       Radiology No results found.   Ct Abdomen Pelvis W Contrast  Result Date: 01/27/2017 CLINICAL DATA:  Abdominal pain starting at 2 a.m. today. Vomiting and loose stools. Pain now concentrates in the right lower quadrant and extends to the right flank. EXAM: CT ABDOMEN AND PELVIS WITH CONTRAST TECHNIQUE: Multidetector CT imaging of the abdomen and pelvis was performed using the standard protocol following bolus administration of intravenous contrast. CONTRAST:  145m ISOVUE-300 IOPAMIDOL (ISOVUE-300) INJECTION 61% COMPARISON:  05/24/2010 FINDINGS: Lower chest: Dependent subsegmental atelectasis in the right lower lobe. Hepatobiliary: Cholecystectomy.  Otherwise unremarkable. Pancreas: Unremarkable Spleen: Unremarkable Adrenals/Urinary Tract: Adrenal glands normal. Delayed excretion on the right associated with right hydronephrosis and hydroureter extending down to several calcifications in the right distal ureter. The first of these is 4.6 cm proximal to the ureter and measures 4 mm in diameter on image 64/204. The second of these calculi is 1.4 cm from the right UVJ and measures 5 mm in diameter on image 65/204. No bladder calculi or left ureteral calculi observed. There are approximately 5 nonobstructive right renal calculi, one of the larger of which measures 6 mm diameter in the right mid kidney on image 82/203. There is some scattered mild scarring in the right kidney along with  several caliceal diverticula. No left renal calculi are observed. Stomach/Bowel: Scattered air- fluid levels in nondilated loops of center abdominal small bowel. Appendix normal. Vascular/Lymphatic: Aortoiliac atherosclerotic vascular disease. 9 mm porta hepatis lymph node observed on image 24/201, upper normal size. Small retroperitoneal lymph nodes are not pathologically enlarged by size criteria. Reproductive: Uterus absent.  No adnexal abnormality. Other: Rim calcified and mildly thick-walled 4.1 by 3.5 cm subcutaneous lesion anterior to the right lower quadrant rectus abdominus muscle on image 76/201, stable in size but with slightly more rim calcification than before. Musculoskeletal: Anterior abdominal wall hernia mesh noted. Mild thoracic and lumbar spondylosis with lower lumbar facet arthropathy and suspected mild impingement at the L4-5 level. IMPRESSION: 1. Moderate right hydronephrosis and moderate right hydroureter with delayed excretion of contrast by the right kidney due to 2 distal ureteral calculi  which are obstructive. These measure in the 4-5 mm diameter range and are separated by about 3 cm. 2. There are also nonobstructive right renal calculi. 3. Scarring in the right kidney with some calyceal diverticulum. 4. Several air-fluid levels in nondilated loops of central abdominal small bowel, potentially a low-grade ileus. Appendix normal. 5. Rim calcified and mildly thick-walled subcutaneous lesion anterior to the right lower quadrant rectus abdominus muscle, possibly a postoperative finding, stable in size compared to 2011 although with some increase in rim calcification. 6.  Aortoiliac atherosclerotic vascular disease. 7. Probable mild impingement at L4-5 due to spondylosis and degenerative disc disease. Electronically Signed   By: Van Clines M.D.   On: 01/27/2017 11:11    Procedures Procedures (including critical care time)  Medications Ordered in ED Medications  ondansetron  (ZOFRAN-ODT) 4 MG disintegrating tablet (  Given 01/27/17 0440)  sodium chloride 0.9 % bolus 1,000 mL (0 mLs Intravenous Stopped 01/27/17 1136)  iopamidol (ISOVUE-300) 61 % injection (100 mLs  Contrast Given 01/27/17 1048)  ketorolac (TORADOL) 15 MG/ML injection 15 mg (15 mg Intravenous Given 01/27/17 1244)     Initial Impression / Assessment and Plan / ED Course  I have reviewed the triage vital signs and the nursing notes.  Pertinent labs & imaging results that were available during my care of the patient were reviewed by me and considered in my medical decision making (see chart for details).     65yF with abdominal pain. Imaging as above. Symptoms currently adequately controlled. Afebrile. PRN pain/nausea meds. Expectant management. It has been determined that no acute conditions requiring further emergency intervention are present at this time. The patient has been advised of the diagnosis and plan. I reviewed any labs and imaging including any potential incidental findings. We have discussed signs and symptoms that warrant return to the ED and they are listed in the discharge instructions.    Final Clinical Impressions(s) / ED Diagnoses   Final diagnoses:  Ureteral stone with hydronephrosis    New Prescriptions Discharge Medication List as of 01/27/2017 12:41 PM    START taking these medications   Details  ondansetron (ZOFRAN) 4 MG tablet Take 1 tablet (4 mg total) by mouth every 8 (eight) hours as needed for nausea or vomiting., Starting Thu 01/27/2017, Print    oxyCODONE-acetaminophen (PERCOCET/ROXICET) 5-325 MG tablet Take 1-2 tablets by mouth every 6 (six) hours as needed for severe pain., Starting Thu 01/27/2017, Print         Virgel Manifold, MD 02/12/17 1032

## 2017-03-03 DIAGNOSIS — L988 Other specified disorders of the skin and subcutaneous tissue: Secondary | ICD-10-CM | POA: Diagnosis not present

## 2017-03-03 DIAGNOSIS — Z85820 Personal history of malignant melanoma of skin: Secondary | ICD-10-CM | POA: Diagnosis not present

## 2017-03-03 DIAGNOSIS — C44622 Squamous cell carcinoma of skin of right upper limb, including shoulder: Secondary | ICD-10-CM | POA: Diagnosis not present

## 2017-03-03 DIAGNOSIS — Z85828 Personal history of other malignant neoplasm of skin: Secondary | ICD-10-CM | POA: Diagnosis not present

## 2017-03-31 DIAGNOSIS — Z7984 Long term (current) use of oral hypoglycemic drugs: Secondary | ICD-10-CM | POA: Diagnosis not present

## 2017-03-31 DIAGNOSIS — E1165 Type 2 diabetes mellitus with hyperglycemia: Secondary | ICD-10-CM | POA: Diagnosis not present

## 2017-03-31 DIAGNOSIS — N200 Calculus of kidney: Secondary | ICD-10-CM | POA: Diagnosis not present

## 2017-03-31 DIAGNOSIS — I10 Essential (primary) hypertension: Secondary | ICD-10-CM | POA: Diagnosis not present

## 2017-03-31 DIAGNOSIS — C44622 Squamous cell carcinoma of skin of right upper limb, including shoulder: Secondary | ICD-10-CM | POA: Diagnosis not present

## 2017-03-31 DIAGNOSIS — E118 Type 2 diabetes mellitus with unspecified complications: Secondary | ICD-10-CM | POA: Diagnosis not present

## 2017-03-31 DIAGNOSIS — E669 Obesity, unspecified: Secondary | ICD-10-CM | POA: Diagnosis not present

## 2017-03-31 DIAGNOSIS — N201 Calculus of ureter: Secondary | ICD-10-CM | POA: Diagnosis not present

## 2017-03-31 DIAGNOSIS — Z6832 Body mass index (BMI) 32.0-32.9, adult: Secondary | ICD-10-CM | POA: Diagnosis not present

## 2017-04-04 DIAGNOSIS — N202 Calculus of kidney with calculus of ureter: Secondary | ICD-10-CM | POA: Diagnosis not present

## 2017-05-12 DIAGNOSIS — B078 Other viral warts: Secondary | ICD-10-CM | POA: Diagnosis not present

## 2017-05-12 DIAGNOSIS — D1721 Benign lipomatous neoplasm of skin and subcutaneous tissue of right arm: Secondary | ICD-10-CM | POA: Diagnosis not present

## 2017-05-12 DIAGNOSIS — L821 Other seborrheic keratosis: Secondary | ICD-10-CM | POA: Diagnosis not present

## 2017-05-12 DIAGNOSIS — L570 Actinic keratosis: Secondary | ICD-10-CM | POA: Diagnosis not present

## 2017-05-12 DIAGNOSIS — L918 Other hypertrophic disorders of the skin: Secondary | ICD-10-CM | POA: Diagnosis not present

## 2017-05-12 DIAGNOSIS — Z85820 Personal history of malignant melanoma of skin: Secondary | ICD-10-CM | POA: Diagnosis not present

## 2017-05-12 DIAGNOSIS — Z85828 Personal history of other malignant neoplasm of skin: Secondary | ICD-10-CM | POA: Diagnosis not present

## 2017-08-29 DIAGNOSIS — Z6832 Body mass index (BMI) 32.0-32.9, adult: Secondary | ICD-10-CM | POA: Diagnosis not present

## 2017-08-29 DIAGNOSIS — I10 Essential (primary) hypertension: Secondary | ICD-10-CM | POA: Diagnosis not present

## 2017-08-29 DIAGNOSIS — E1165 Type 2 diabetes mellitus with hyperglycemia: Secondary | ICD-10-CM | POA: Diagnosis not present

## 2017-08-29 DIAGNOSIS — Z1389 Encounter for screening for other disorder: Secondary | ICD-10-CM | POA: Diagnosis not present

## 2017-08-29 DIAGNOSIS — Z7984 Long term (current) use of oral hypoglycemic drugs: Secondary | ICD-10-CM | POA: Diagnosis not present

## 2017-08-29 DIAGNOSIS — E669 Obesity, unspecified: Secondary | ICD-10-CM | POA: Diagnosis not present

## 2017-08-29 DIAGNOSIS — E785 Hyperlipidemia, unspecified: Secondary | ICD-10-CM | POA: Diagnosis not present

## 2017-08-29 DIAGNOSIS — E118 Type 2 diabetes mellitus with unspecified complications: Secondary | ICD-10-CM | POA: Diagnosis not present

## 2017-08-29 DIAGNOSIS — Z23 Encounter for immunization: Secondary | ICD-10-CM | POA: Diagnosis not present

## 2017-08-29 DIAGNOSIS — Z0000 Encounter for general adult medical examination without abnormal findings: Secondary | ICD-10-CM | POA: Diagnosis not present

## 2017-11-14 DIAGNOSIS — J209 Acute bronchitis, unspecified: Secondary | ICD-10-CM | POA: Diagnosis not present

## 2017-11-14 DIAGNOSIS — H6122 Impacted cerumen, left ear: Secondary | ICD-10-CM | POA: Diagnosis not present

## 2017-11-16 DIAGNOSIS — D485 Neoplasm of uncertain behavior of skin: Secondary | ICD-10-CM | POA: Diagnosis not present

## 2017-11-16 DIAGNOSIS — L820 Inflamed seborrheic keratosis: Secondary | ICD-10-CM | POA: Diagnosis not present

## 2017-11-16 DIAGNOSIS — L570 Actinic keratosis: Secondary | ICD-10-CM | POA: Diagnosis not present

## 2017-11-16 DIAGNOSIS — Z85828 Personal history of other malignant neoplasm of skin: Secondary | ICD-10-CM | POA: Diagnosis not present

## 2017-11-16 DIAGNOSIS — B078 Other viral warts: Secondary | ICD-10-CM | POA: Diagnosis not present

## 2017-11-16 DIAGNOSIS — L821 Other seborrheic keratosis: Secondary | ICD-10-CM | POA: Diagnosis not present

## 2017-11-16 DIAGNOSIS — Z85820 Personal history of malignant melanoma of skin: Secondary | ICD-10-CM | POA: Diagnosis not present

## 2017-11-16 DIAGNOSIS — D1801 Hemangioma of skin and subcutaneous tissue: Secondary | ICD-10-CM | POA: Diagnosis not present

## 2018-02-27 DIAGNOSIS — E118 Type 2 diabetes mellitus with unspecified complications: Secondary | ICD-10-CM | POA: Diagnosis not present

## 2018-02-27 DIAGNOSIS — E785 Hyperlipidemia, unspecified: Secondary | ICD-10-CM | POA: Diagnosis not present

## 2018-02-27 DIAGNOSIS — I10 Essential (primary) hypertension: Secondary | ICD-10-CM | POA: Diagnosis not present

## 2018-04-13 IMAGING — CT CT ABD-PELV W/ CM
2 of 5 series · 10 of 46 positions shown, 11 images · IV contrast (Iodine)
Comparison: 05/24/2010

CLINICAL DATA: Abdominal pain starting at 2 a.m. today. Vomiting
and loose stools. Pain now concentrates in the right lower quadrant
and extends to the right flank.

EXAM:
CT ABDOMEN AND PELVIS WITH CONTRAST
TECHNIQUE: Multidetector CT imaging of the abdomen and pelvis was performed
using the standard protocol following bolus administration of
intravenous contrast.
CONTRAST:  100mL MK1H07-X00 IOPAMIDOL (MK1H07-X00) INJECTION 61%

[Series 201: routine, idose (2) · axial · 0.76mm/px · z∈[+2,+367]mm · 7 of 95 slices shown, 8 images]
[im 11/95  soft-tissue]
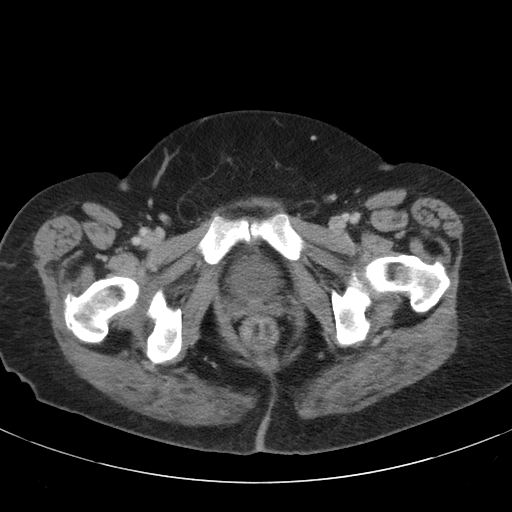
[im 11/95  bone]
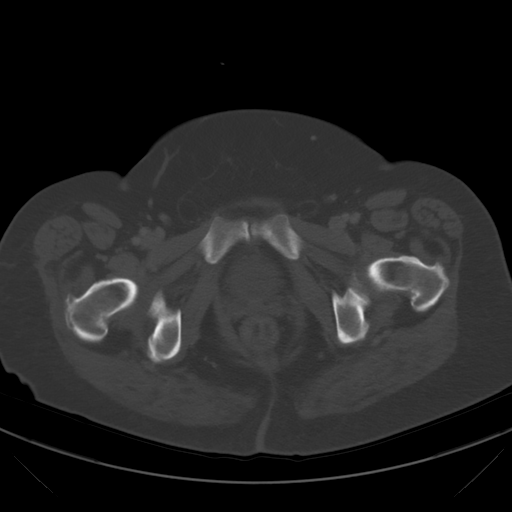
[im 21/95  soft-tissue]
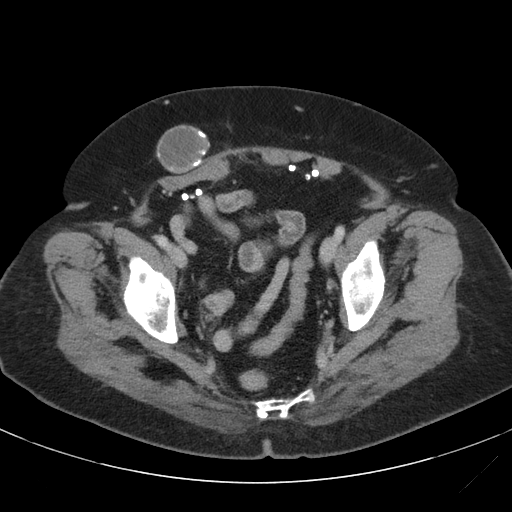
[im 37/95  soft-tissue]
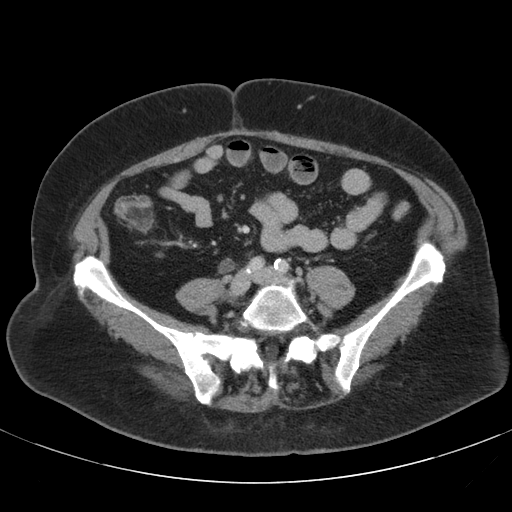
[im 48/95  soft-tissue]
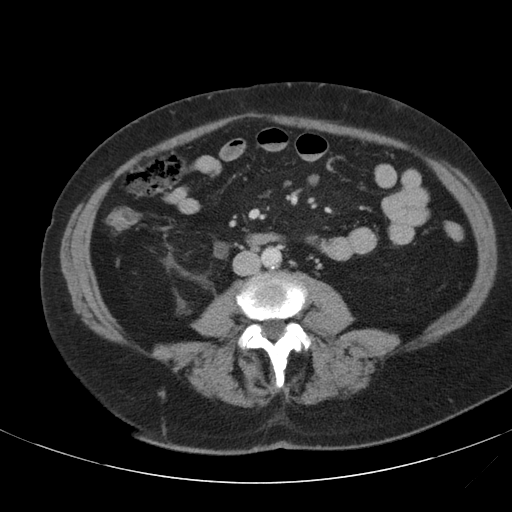
[im 58/95  soft-tissue]
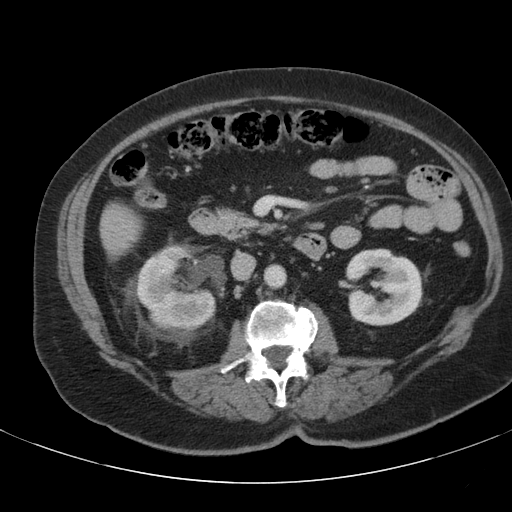
[im 74/95  soft-tissue]
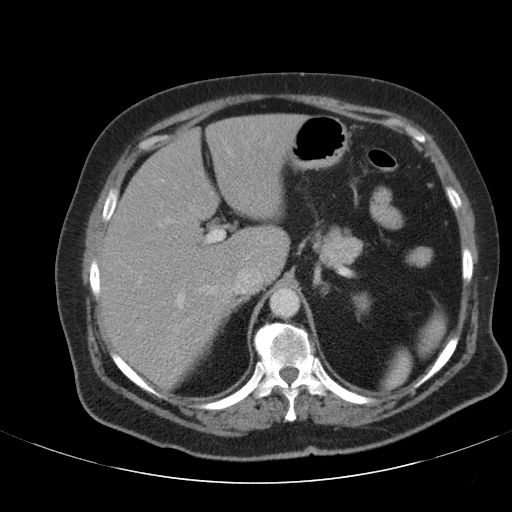
[im 84/95  soft-tissue]
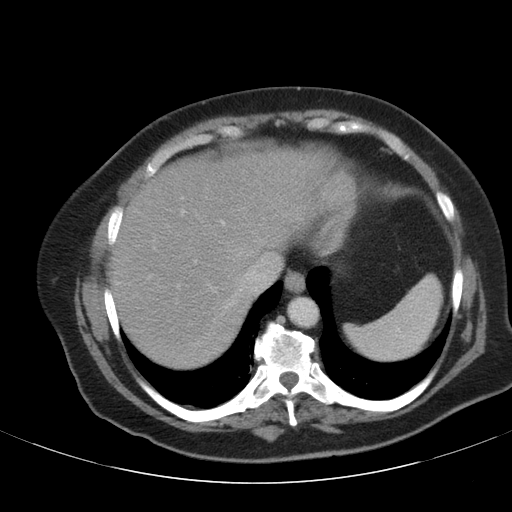

[Series 203: coronals, idose (2) · coronal · 0.45mm/px · 3 of 141 slices shown]
[im 47/141  soft-tissue]
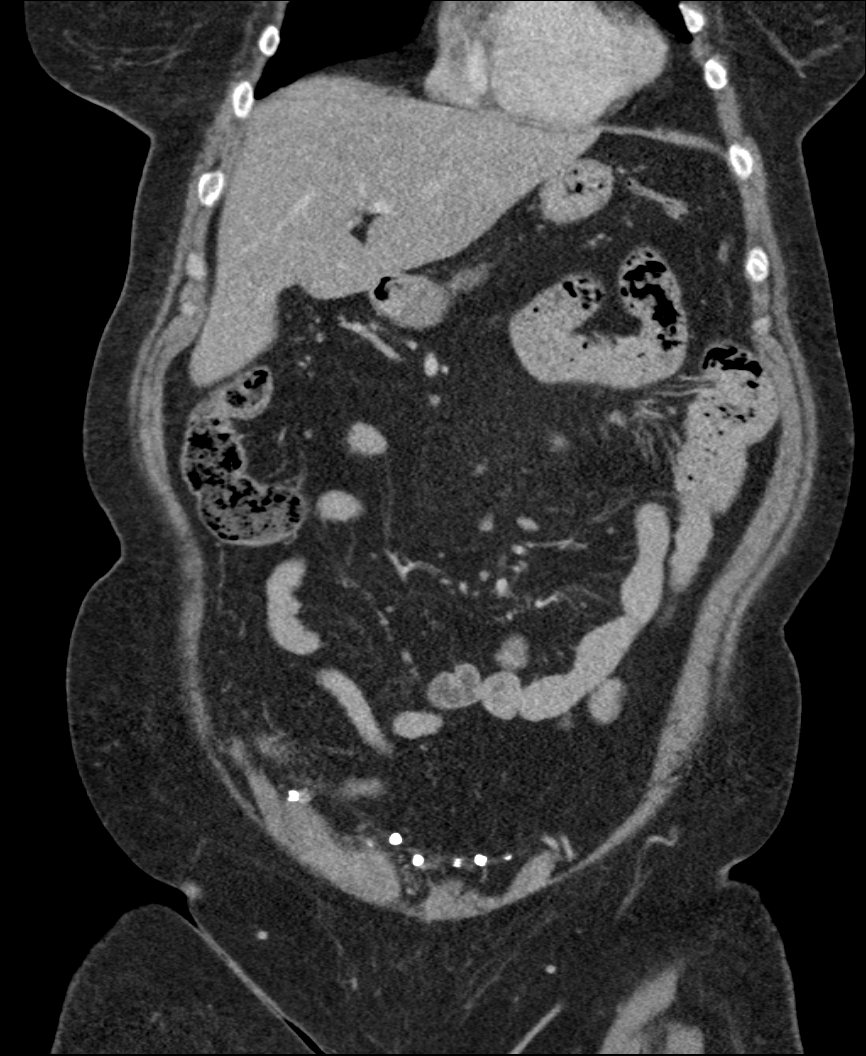
[im 63/141  soft-tissue]
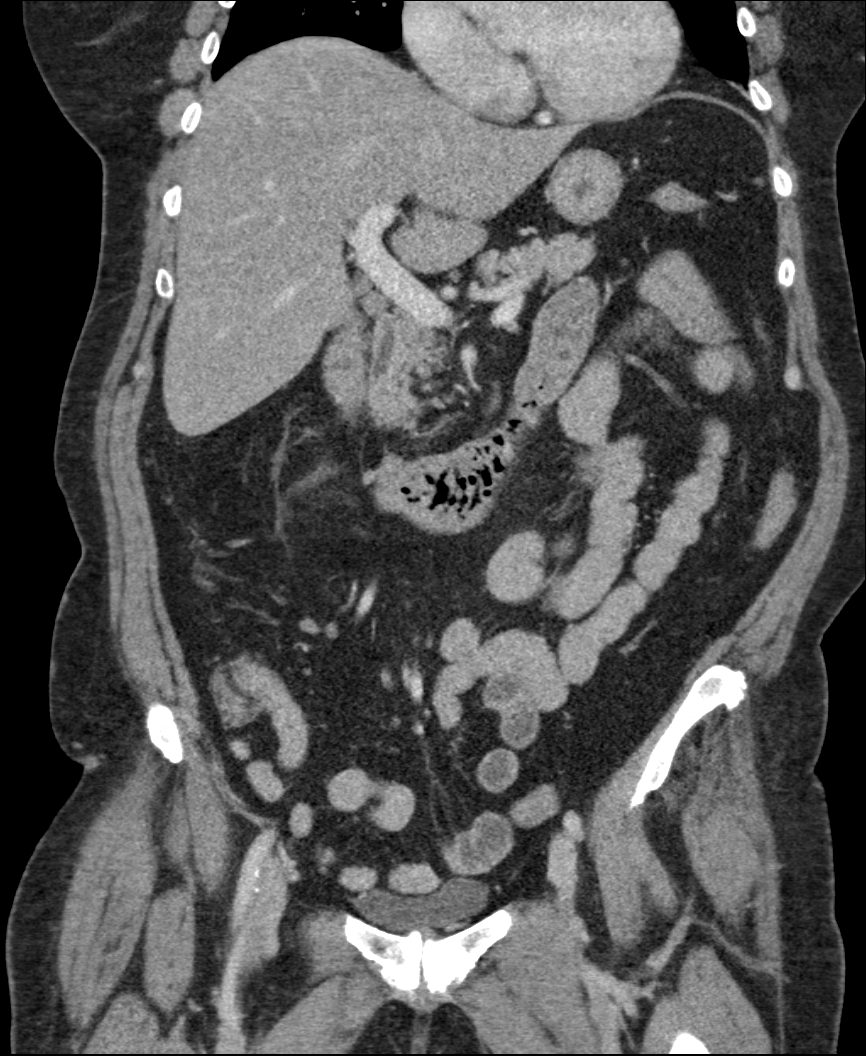
[im 78/141  soft-tissue]
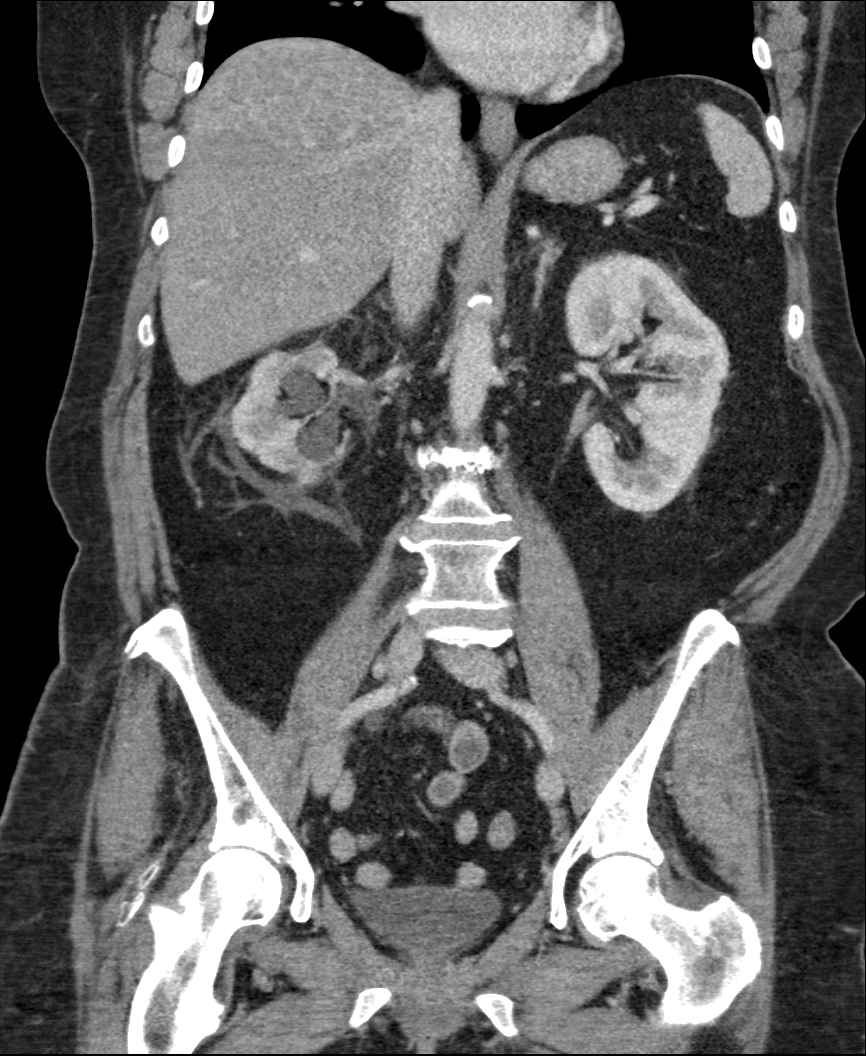

[10 of 46 positions shown; findings below may reference images not displayed]

FINDINGS: Lower chest: Dependent subsegmental atelectasis in the right lower
lobe.

Hepatobiliary: Cholecystectomy.  Otherwise unremarkable.

Pancreas: Unremarkable

Spleen: Unremarkable

Adrenals/Urinary Tract: Adrenal glands normal.

Delayed excretion on the right associated with right hydronephrosis
and hydroureter extending down to several calcifications in the
right distal ureter. The first of these is 4.6 cm proximal to the
ureter and measures 4 mm in diameter on image 64/204. The second of
these calculi is 1.4 cm from the right UVJ and measures 5 mm in
diameter on image 65/204.

No bladder calculi or left ureteral calculi observed. There are
approximately 5 nonobstructive right renal calculi, one of the
larger of which measures 6 mm diameter in the right mid kidney on
image 82/203.

There is some scattered mild scarring in the right kidney along with
several caliceal diverticula.

No left renal calculi are observed.

Stomach/Bowel: Scattered air- fluid levels in nondilated loops of
center abdominal small bowel. Appendix normal.

Vascular/Lymphatic: Aortoiliac atherosclerotic vascular disease. 9
mm porta hepatis lymph node observed on image 24/201, upper normal
size. Small retroperitoneal lymph nodes are not pathologically
enlarged by size criteria.

Reproductive: Uterus absent.  No adnexal abnormality.

Other: Rim calcified and mildly thick-walled 4.1 by 3.5 cm
subcutaneous lesion anterior to the right lower quadrant rectus
abdominus muscle on image 76/201, stable in size but with slightly
more rim calcification than before.

Musculoskeletal: Anterior abdominal wall hernia mesh noted. Mild
thoracic and lumbar spondylosis with lower lumbar facet arthropathy
and suspected mild impingement at the L4-5 level.
IMPRESSION: 1. Moderate right hydronephrosis and moderate right hydroureter with
delayed excretion of contrast by the right kidney due to 2 distal
ureteral calculi which are obstructive. These measure in the 4-5 mm
diameter range and are separated by about 3 cm.
2. There are also nonobstructive right renal calculi.
3. Scarring in the right kidney with some calyceal diverticulum.
4. Several air-fluid levels in nondilated loops of central abdominal
small bowel, potentially a low-grade ileus. Appendix normal.
5. Rim calcified and mildly thick-walled subcutaneous lesion
anterior to the right lower quadrant rectus abdominus muscle,
possibly a postoperative finding, stable in size compared to 9211
although with some increase in rim calcification.
6.  Aortoiliac atherosclerotic vascular disease.
7. Probable mild impingement at L4-5 due to spondylosis and
degenerative disc disease.

## 2018-05-16 DIAGNOSIS — L570 Actinic keratosis: Secondary | ICD-10-CM | POA: Diagnosis not present

## 2018-05-16 DIAGNOSIS — D1801 Hemangioma of skin and subcutaneous tissue: Secondary | ICD-10-CM | POA: Diagnosis not present

## 2018-05-16 DIAGNOSIS — L821 Other seborrheic keratosis: Secondary | ICD-10-CM | POA: Diagnosis not present

## 2018-05-16 DIAGNOSIS — C44519 Basal cell carcinoma of skin of other part of trunk: Secondary | ICD-10-CM | POA: Diagnosis not present

## 2018-05-16 DIAGNOSIS — Z85828 Personal history of other malignant neoplasm of skin: Secondary | ICD-10-CM | POA: Diagnosis not present

## 2018-05-16 DIAGNOSIS — D485 Neoplasm of uncertain behavior of skin: Secondary | ICD-10-CM | POA: Diagnosis not present

## 2018-05-16 DIAGNOSIS — Z85820 Personal history of malignant melanoma of skin: Secondary | ICD-10-CM | POA: Diagnosis not present

## 2018-05-23 DIAGNOSIS — Z85820 Personal history of malignant melanoma of skin: Secondary | ICD-10-CM | POA: Diagnosis not present

## 2018-05-23 DIAGNOSIS — Z85828 Personal history of other malignant neoplasm of skin: Secondary | ICD-10-CM | POA: Diagnosis not present

## 2018-08-31 DIAGNOSIS — Z1239 Encounter for other screening for malignant neoplasm of breast: Secondary | ICD-10-CM | POA: Diagnosis not present

## 2018-08-31 DIAGNOSIS — E785 Hyperlipidemia, unspecified: Secondary | ICD-10-CM | POA: Diagnosis not present

## 2018-08-31 DIAGNOSIS — E669 Obesity, unspecified: Secondary | ICD-10-CM | POA: Diagnosis not present

## 2018-08-31 DIAGNOSIS — I10 Essential (primary) hypertension: Secondary | ICD-10-CM | POA: Diagnosis not present

## 2018-08-31 DIAGNOSIS — Z23 Encounter for immunization: Secondary | ICD-10-CM | POA: Diagnosis not present

## 2018-08-31 DIAGNOSIS — E118 Type 2 diabetes mellitus with unspecified complications: Secondary | ICD-10-CM | POA: Diagnosis not present

## 2018-08-31 DIAGNOSIS — Z0000 Encounter for general adult medical examination without abnormal findings: Secondary | ICD-10-CM | POA: Diagnosis not present

## 2018-08-31 DIAGNOSIS — Z1159 Encounter for screening for other viral diseases: Secondary | ICD-10-CM | POA: Diagnosis not present

## 2018-08-31 DIAGNOSIS — Z1339 Encounter for screening examination for other mental health and behavioral disorders: Secondary | ICD-10-CM | POA: Diagnosis not present

## 2018-11-14 DIAGNOSIS — L821 Other seborrheic keratosis: Secondary | ICD-10-CM | POA: Diagnosis not present

## 2018-11-14 DIAGNOSIS — Z85828 Personal history of other malignant neoplasm of skin: Secondary | ICD-10-CM | POA: Diagnosis not present

## 2018-11-14 DIAGNOSIS — L570 Actinic keratosis: Secondary | ICD-10-CM | POA: Diagnosis not present

## 2018-11-14 DIAGNOSIS — D1801 Hemangioma of skin and subcutaneous tissue: Secondary | ICD-10-CM | POA: Diagnosis not present

## 2018-11-14 DIAGNOSIS — Z85820 Personal history of malignant melanoma of skin: Secondary | ICD-10-CM | POA: Diagnosis not present

## 2018-11-30 DIAGNOSIS — E118 Type 2 diabetes mellitus with unspecified complications: Secondary | ICD-10-CM | POA: Diagnosis not present

## 2018-11-30 DIAGNOSIS — I10 Essential (primary) hypertension: Secondary | ICD-10-CM | POA: Diagnosis not present

## 2018-11-30 DIAGNOSIS — E785 Hyperlipidemia, unspecified: Secondary | ICD-10-CM | POA: Diagnosis not present

## 2018-11-30 DIAGNOSIS — E1169 Type 2 diabetes mellitus with other specified complication: Secondary | ICD-10-CM | POA: Diagnosis not present

## 2018-12-01 DIAGNOSIS — E1169 Type 2 diabetes mellitus with other specified complication: Secondary | ICD-10-CM | POA: Diagnosis not present

## 2018-12-01 DIAGNOSIS — L84 Corns and callosities: Secondary | ICD-10-CM | POA: Diagnosis not present

## 2018-12-14 ENCOUNTER — Ambulatory Visit: Payer: PPO | Admitting: Podiatry

## 2018-12-14 ENCOUNTER — Encounter: Payer: Self-pay | Admitting: Podiatry

## 2018-12-14 VITALS — BP 151/78 | HR 70 | Resp 16

## 2018-12-14 DIAGNOSIS — M79674 Pain in right toe(s): Secondary | ICD-10-CM | POA: Diagnosis not present

## 2018-12-14 DIAGNOSIS — L84 Corns and callosities: Secondary | ICD-10-CM

## 2018-12-14 DIAGNOSIS — E119 Type 2 diabetes mellitus without complications: Secondary | ICD-10-CM

## 2018-12-14 DIAGNOSIS — M79675 Pain in left toe(s): Secondary | ICD-10-CM | POA: Diagnosis not present

## 2018-12-14 DIAGNOSIS — B351 Tinea unguium: Secondary | ICD-10-CM

## 2018-12-14 NOTE — Patient Instructions (Signed)
Diabetes Mellitus and Foot Care  Foot care is an important part of your health, especially when you have diabetes. Diabetes may cause you to have problems because of poor blood flow (circulation) to your feet and legs, which can cause your skin to:   Become thinner and drier.   Break more easily.   Heal more slowly.   Peel and crack.  You may also have nerve damage (neuropathy) in your legs and feet, causing decreased feeling in them. This means that you may not notice minor injuries to your feet that could lead to more serious problems. Noticing and addressing any potential problems early is the best way to prevent future foot problems.  How to care for your feet  Foot hygiene   Wash your feet daily with warm water and mild soap. Do not use hot water. Then, pat your feet and the areas between your toes until they are completely dry. Do not soak your feet as this can dry your skin.   Trim your toenails straight across. Do not dig under them or around the cuticle. File the edges of your nails with an emery board or nail file.   Apply a moisturizing lotion or petroleum jelly to the skin on your feet and to dry, brittle toenails. Use lotion that does not contain alcohol and is unscented. Do not apply lotion between your toes.  Shoes and socks   Wear clean socks or stockings every day. Make sure they are not too tight. Do not wear knee-high stockings since they may decrease blood flow to your legs.   Wear shoes that fit properly and have enough cushioning. Always look in your shoes before you put them on to be sure there are no objects inside.   To break in new shoes, wear them for just a few hours a day. This prevents injuries on your feet.  Wounds, scrapes, corns, and calluses   Check your feet daily for blisters, cuts, bruises, sores, and redness. If you cannot see the bottom of your feet, use a mirror or ask someone for help.   Do not cut corns or calluses or try to remove them with medicine.   If you  find a minor scrape, cut, or break in the skin on your feet, keep it and the skin around it clean and dry. You may clean these areas with mild soap and water. Do not clean the area with peroxide, alcohol, or iodine.   If you have a wound, scrape, corn, or callus on your foot, look at it several times a day to make sure it is healing and not infected. Check for:  ? Redness, swelling, or pain.  ? Fluid or blood.  ? Warmth.  ? Pus or a bad smell.  General instructions   Do not cross your legs. This may decrease blood flow to your feet.   Do not use heating pads or hot water bottles on your feet. They may burn your skin. If you have lost feeling in your feet or legs, you may not know this is happening until it is too late.   Protect your feet from hot and cold by wearing shoes, such as at the beach or on hot pavement.   Schedule a complete foot exam at least once a year (annually) or more often if you have foot problems. If you have foot problems, report any cuts, sores, or bruises to your health care provider immediately.  Contact a health care provider if:     You have a medical condition that increases your risk of infection and you have any cuts, sores, or bruises on your feet.   You have an injury that is not healing.   You have redness on your legs or feet.   You feel burning or tingling in your legs or feet.   You have pain or cramps in your legs and feet.   Your legs or feet are numb.   Your feet always feel cold.   You have pain around a toenail.  Get help right away if:   You have a wound, scrape, corn, or callus on your foot and:  ? You have pain, swelling, or redness that gets worse.  ? You have fluid or blood coming from the wound, scrape, corn, or callus.  ? Your wound, scrape, corn, or callus feels warm to the touch.  ? You have pus or a bad smell coming from the wound, scrape, corn, or callus.  ? You have a fever.  ? You have a red line going up your leg.  Summary   Check your feet every day  for cuts, sores, red spots, swelling, and blisters.   Moisturize feet and legs daily.   Wear shoes that fit properly and have enough cushioning.   If you have foot problems, report any cuts, sores, or bruises to your health care provider immediately.   Schedule a complete foot exam at least once a year (annually) or more often if you have foot problems.  This information is not intended to replace advice given to you by your health care provider. Make sure you discuss any questions you have with your health care provider.  Document Released: 12/03/2000 Document Revised: 01/18/2018 Document Reviewed: 01/07/2017  Elsevier Interactive Patient Education  2019 [employer]

## 2019-01-13 ENCOUNTER — Encounter: Payer: Self-pay | Admitting: Podiatry

## 2019-01-13 NOTE — Progress Notes (Signed)
Subjective: [name] presents today referred by [facility] physicians.  She states she sees Dr. Vara Hansen (sp?). Left has history of diabetes managed with metformin and Januvia.   Today she complains of painful lesion on the plantar aspect of her foot she has had pain for the past 2 months.  Onset was gradual.  Patient states the area burns and feels no.  She relates some days it is hard to walk.  She has used medicated corn patches in the past.  She cleans churches 5 days a week.   She also is requesting diabetic shoes.    Past Medical History:  Diagnosis Date  . Diabetes mellitus without complication ([place])   . Hypertension     There are no active problems to display for this patient.   Past Surgical History:  Procedure Laterality Date  . ABDOMINAL HYSTERECTOMY    . BACK SURGERY    . HERNIA REPAIR    . JOINT REPLACEMENT    . SKIN CANCER EXCISION     on the face and arms   Medications reviewed  No Known Allergies  Social History   Occupational History  . Not on file  Tobacco Use  . Smoking status: Never Smoker  . Smokeless tobacco: Never Used  Substance and Sexual Activity  . Alcohol use: No  . Drug use: No  . Sexual activity: Not on file    History reviewed. No pertinent family history.  Immunization History  Administered Date(s) Administered  . Tdap 06/26/2016     Review of systems: Positive Findings in bold print.  Constitutional:  chills, fatigue, fever, sweats, weight change Communication: Optometrist, sign Ecologist, hand writing, iPad/Android device Head: headaches, head injury Eyes: changes in vision, eye pain, glaucoma, cataracts, macular degeneration, diplopia, glare,  light sensitivity, eyeglasses or contacts, blindness Ears nose mouth throat: Hard of hearing, ringing in ears, deaf, sign language,  vertigo,   nosebleeds,  rhinitis,  cold sores, snoring, swollen glands Cardiovascular: HTN, edema, arrhythmia, pacemaker in place,  defibrillator in place,  chest pain/tightness, chronic anticoagulation, blood clot, heart failure Peripheral Vascular: leg cramps, varicose veins, blood clots, lymphedema Respiratory:  difficulty breathing, denies congestion, SOB, wheezing, cough, emphysema Gastrointestinal: change in appetite or weight, abdominal pain, constipation, diarrhea, nausea, vomiting, vomiting blood, change in bowel habits, abdominal pain, jaundice, rectal bleeding, hemorrhoids, Genitourinary:  nocturia,  pain on urination,  blood in urine, Foley catheter, urinary urgency Musculoskeletal: uses mobility aid,  cramping, stiff joints, painful joints, decreased joint motion, fractures, OA, gout Skin: +changes in toenails, color change, dryness, itching, mole changes,  rash  Neurological: headaches, numbness in feet, paresthesias in feet, burning in feet, fainting,  seizures, change in speech. denies headaches, memory problems/poor historian, cerebral palsy, weakness, paralysis Endocrine: diabetes, hypothyroidism, hyperthyroidism,  goiter, dry mouth, flushing, heat intolerance,  cold intolerance,  excessive thirst, denies polyuria,  nocturia Hematological:  easy bleeding, excessive bleeding, easy bruising, enlarged lymph nodes, on long term blood thinner, history of past transusions Allergy/immunological:  hives, eczema, frequent infections, multiple drug allergies, seasonal allergies, transplant recipient Psychiatric:  anxiety, depression, mood disorder, suicidal ideations, hallucinations   Objective: Vascular Examination: Capillary refill time immediate x 10 digits Dorsalis pedis and posterior tibial pulses present b/l No digital hair x 10 digits Skin temperature gradient WNL b/l  Dermatological Examination: Skin with normal turgor, texture and tone b/l  Toenails 1-5 b/l discolored, thick, dystrophic with subungual debris and pain with palpation to nailbeds due to thickness of nails.  Right  fourth digit with noted  interdigital corn on the medial aspect of both proximal interphalangeal joint.  There is a hyperkeratotic lesion noted on the right fifth digit distal phalanx.  There is tenderness to palpation of both lesions.  Hyperkeratotic lesion noted submetatarsal head 5 bilaterally with porokeratotic lesion on the left foot plantarly.  Musculoskeletal: Muscle strength 5/5 to all LE muscle groups  Neurological: Sensation intact with 10 gram monofilament Vibratory sensation intact.  Assessment: 1. Painful onychomycosis toenails 1-5 b/l  2. Painful corns and calluses noted bilaterally: Right fourth digit, right fifth digit, submetatarsal head 5 bilaterally plantar left foot  3. NIDDM  Plan: 1. Discussed diabetic foot care principles.  She was advised not to ever use medicated callus or corn medication again as this could pose a danger to her because she is diabetic.  She related understanding.  Literature dispensed on today. 2. Toenails 1-5 b/l were debrided in length and girth without iatrogenic bleeding. 3. Porokeratotic amount of hyperkeratotic lesions were pared without incident.  Digital silicone toe pads were dispensed to patient.  We will start her diabetic shoe paper paperwork for her and forward to her PCP. 4. Patient to continue soft, supportive shoe gear 5. Patient to report any pedal injuries to medical professional immediately. 6. Follow up 3 months. Patient/POA to call should there be a concern in the interim.

## 2019-01-15 DIAGNOSIS — E118 Type 2 diabetes mellitus with unspecified complications: Secondary | ICD-10-CM | POA: Diagnosis not present

## 2019-01-15 DIAGNOSIS — I10 Essential (primary) hypertension: Secondary | ICD-10-CM | POA: Diagnosis not present

## 2019-01-15 DIAGNOSIS — E785 Hyperlipidemia, unspecified: Secondary | ICD-10-CM | POA: Diagnosis not present

## 2019-01-15 DIAGNOSIS — E1169 Type 2 diabetes mellitus with other specified complication: Secondary | ICD-10-CM | POA: Diagnosis not present

## 2019-01-24 ENCOUNTER — Ambulatory Visit: Payer: HMO | Admitting: Orthotics

## 2019-01-24 DIAGNOSIS — E119 Type 2 diabetes mellitus without complications: Secondary | ICD-10-CM

## 2019-01-24 DIAGNOSIS — L84 Corns and callosities: Secondary | ICD-10-CM

## 2019-02-05 ENCOUNTER — Other Ambulatory Visit: Payer: Self-pay

## 2019-02-05 NOTE — Patient Outreach (Signed)
  [facility]) Care Management Chronic Special Needs Program   02/05/2019  Name: [name], DOB: Feb 01, 1951  MRN: [id number]  The client was discussed in today's interdisciplinary care team meeting.  The following issues were discussed:  Client's needs, Key risk triggers/risk stratification, Care Plan, Coordination of care and Issues/barriers to care  Participants present:  Mahlon Gammon, RNCM; Peter Garter, RNCM; Thea Silversmith, RNCM  Recommendations:  Send information regarding Hypertension  Plan:  Outreach within 2-4 months.  Thea Silversmith, RN, MSN, [place] [phone]

## 2019-02-14 DIAGNOSIS — E118 Type 2 diabetes mellitus with unspecified complications: Secondary | ICD-10-CM | POA: Diagnosis not present

## 2019-02-14 DIAGNOSIS — I10 Essential (primary) hypertension: Secondary | ICD-10-CM | POA: Diagnosis not present

## 2019-02-14 DIAGNOSIS — E1169 Type 2 diabetes mellitus with other specified complication: Secondary | ICD-10-CM | POA: Diagnosis not present

## 2019-02-14 DIAGNOSIS — E785 Hyperlipidemia, unspecified: Secondary | ICD-10-CM | POA: Diagnosis not present

## 2019-02-22 DIAGNOSIS — B029 Zoster without complications: Secondary | ICD-10-CM | POA: Diagnosis not present

## 2019-02-22 DIAGNOSIS — B009 Herpesviral infection, unspecified: Secondary | ICD-10-CM | POA: Diagnosis not present

## 2019-02-22 DIAGNOSIS — L438 Other lichen planus: Secondary | ICD-10-CM | POA: Diagnosis not present

## 2019-02-22 DIAGNOSIS — D485 Neoplasm of uncertain behavior of skin: Secondary | ICD-10-CM | POA: Diagnosis not present

## 2019-02-22 DIAGNOSIS — E1169 Type 2 diabetes mellitus with other specified complication: Secondary | ICD-10-CM | POA: Diagnosis not present

## 2019-02-22 DIAGNOSIS — L0889 Other specified local infections of the skin and subcutaneous tissue: Secondary | ICD-10-CM | POA: Diagnosis not present

## 2019-02-22 DIAGNOSIS — Z85828 Personal history of other malignant neoplasm of skin: Secondary | ICD-10-CM | POA: Diagnosis not present

## 2019-02-22 DIAGNOSIS — E118 Type 2 diabetes mellitus with unspecified complications: Secondary | ICD-10-CM | POA: Diagnosis not present

## 2019-02-22 DIAGNOSIS — L308 Other specified dermatitis: Secondary | ICD-10-CM | POA: Diagnosis not present

## 2019-02-22 DIAGNOSIS — E785 Hyperlipidemia, unspecified: Secondary | ICD-10-CM | POA: Diagnosis not present

## 2019-02-22 DIAGNOSIS — I10 Essential (primary) hypertension: Secondary | ICD-10-CM | POA: Diagnosis not present

## 2019-02-22 DIAGNOSIS — Z85820 Personal history of malignant melanoma of skin: Secondary | ICD-10-CM | POA: Diagnosis not present

## 2019-03-15 ENCOUNTER — Telehealth: Payer: Self-pay | Admitting: Podiatry

## 2019-03-15 ENCOUNTER — Ambulatory Visit: Payer: PPO | Admitting: Podiatry

## 2019-03-15 NOTE — Telephone Encounter (Signed)
Pt left message yesterday asking about diabetic shoe status..  I returned call and left message that they were on back order and should be shipping by beginning of next week and when they come in I will call pt to schedule an appt to pick them up.Marland KitchenMarland Kitchen

## 2019-03-19 NOTE — Progress Notes (Signed)

## 2019-04-05 DIAGNOSIS — E785 Hyperlipidemia, unspecified: Secondary | ICD-10-CM | POA: Diagnosis not present

## 2019-04-05 DIAGNOSIS — E118 Type 2 diabetes mellitus with unspecified complications: Secondary | ICD-10-CM | POA: Diagnosis not present

## 2019-04-05 DIAGNOSIS — I10 Essential (primary) hypertension: Secondary | ICD-10-CM | POA: Diagnosis not present

## 2019-04-05 DIAGNOSIS — E1169 Type 2 diabetes mellitus with other specified complication: Secondary | ICD-10-CM | POA: Diagnosis not present

## 2019-04-17 ENCOUNTER — Other Ambulatory Visit: Payer: Self-pay

## 2019-04-17 NOTE — Patient Outreach (Addendum)
  [place] [facility]) Care Management Chronic Special Needs Program  04/17/2019  Name: [name] DOB: 1951/05/29  MRN: [id number]  [name] is enrolled in a chronic special needs plan for Diabetes. RNCM called to follow up and review individualized care plan. Introduced the chronic care management program, importance of client participation, and taking their care plan to all provider appointments and inpatient facilities.  Reviewed the transition of care process.  Subjective: client reports a history of diabetes and hypertension. Client states she has lost about 15 pounds at her last primary care office visit in December. She states she was active in a structured exercise class, and since COVID 19, she continues to be active. She reports last A1C 7.4. she continues to monitor blood sugars, exercise and manage intake.  Goals Addressed            This Visit's Progress   .  Acknowledge receipt of Programme researcher, broadcasting/film/video      . Client understands the importance of follow-up with providers by attending scheduled visits   On track   . Client will use Assistive Devices as needed and verbalize understanding of device use   On track   . Client will verbalize knowledge of self management of Hypertension as evidences by BP reading of 140/90 or less; or as defined by provider   On track   . Maintain timely refills of diabetic medication as prescribed within the year .   On track   . Obtain annual  Lipid Profile, LDL-C   On track   . Obtain Annual Eye (retinal)  Exam    On track   . Obtain Annual Foot Exam   On track   . Obtain annual screen for micro albuminuria (urine) , nephropathy (kidney problems)   On track   . Obtain Hemoglobin A1C at least 2 times per year   On track   . Visit Primary Care Provider or Endocrinologist at least 2 times per year    On track     COVID-19 signs/symptoms/precautions discussed, but reinforced if true emergency for example signs/symptoms  stroke, difficulty breathing chest pain, new dizziness, confusion encouraged to require 911 or ED visits. RNCM reinforced 24 hour nurse advice line, also reinforced call health care concierge for benefit and in-network provider questions.  Plan: send updated care plan and follow up in 7-8 months. Send advanced care plan per client request.  Thea Silversmith, RN, MSN, [place] [phone]

## 2019-04-27 ENCOUNTER — Ambulatory Visit (INDEPENDENT_AMBULATORY_CARE_PROVIDER_SITE_OTHER): Payer: HMO | Admitting: Orthotics

## 2019-04-27 ENCOUNTER — Other Ambulatory Visit: Payer: Self-pay

## 2019-04-27 ENCOUNTER — Ambulatory Visit (INDEPENDENT_AMBULATORY_CARE_PROVIDER_SITE_OTHER): Payer: HMO | Admitting: Podiatry

## 2019-04-27 DIAGNOSIS — M79675 Pain in left toe(s): Secondary | ICD-10-CM | POA: Diagnosis not present

## 2019-04-27 DIAGNOSIS — E119 Type 2 diabetes mellitus without complications: Secondary | ICD-10-CM

## 2019-04-27 DIAGNOSIS — M79674 Pain in right toe(s): Secondary | ICD-10-CM

## 2019-04-27 DIAGNOSIS — B351 Tinea unguium: Secondary | ICD-10-CM

## 2019-04-27 DIAGNOSIS — L84 Corns and callosities: Secondary | ICD-10-CM | POA: Diagnosis not present

## 2019-04-27 NOTE — Progress Notes (Signed)

## 2019-05-03 ENCOUNTER — Encounter: Payer: Self-pay | Admitting: Podiatry

## 2019-05-03 NOTE — Progress Notes (Signed)
Subjective: Patient presents today for preventative diabetic foot care. She relates tender, elongated toenails b/l feet which are  aggravated when wearing enclosed shoe gear. Pain is relieved with periodic professional debridement.  Patient states she may have had a blister under the 4th toe on her left foot which has resolved. She is unsure of the onset, but it has resolved.  Patient also has corns and calluses b/l which put her at risk due to her diabetes.  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  She is also picking up her diabetic shoes on today.   Leeroy Cha, MD is her PCP.   Current Outpatient Medications:  .  augmented betamethasone dipropionate (DIPROLENE-AF) 0.05 % cream, , Disp: , Rfl:  .  Blood Glucose Monitoring Suppl (ONE TOUCH ULTRA 2) w/Device KIT, U TO TEST BLOOD SUGAR UTD, Disp: , Rfl:  .  cholecalciferol (VITAMIN D) 1000 units tablet, Take 1,000 Units by mouth daily., Disp: , Rfl:  .  empagliflozin (JARDIANCE) 25 MG TABS tablet, Take 25 mg by mouth daily., Disp: , Rfl:  .  Lancets (ONETOUCH DELICA PLUS [id number]) MISC, USE AS DIRECTED USE FOR CHECKING BLOOD SUGARS ONCE DAILY, Disp: , Rfl:  .  lisinopril-hydrochlorothiazide (PRINZIDE,ZESTORETIC) 10-12.5 MG tablet, Take 1 tablet by mouth daily., Disp: , Rfl:  .  metFORMIN (GLUCOPHAGE-XR) 500 MG 24 hr tablet, Take 500 mg by mouth daily with breakfast. Take 4 tablets at one time during my evening meal; this equals 2000 mg every day, Disp: , Rfl:  .  Multiple Vitamin (MULTIVITAMIN) tablet, Take 1 tablet by mouth daily., Disp: , Rfl:  .  ONE TOUCH ULTRA TEST test strip, , Disp: , Rfl:  .  simvastatin (ZOCOR) 10 MG tablet, Take 10 mg by mouth daily., Disp: , Rfl:  .  valACYclovir (VALTREX) 1000 MG tablet, TK 1 T PO TID FOR 1 WK UTD, Disp: , Rfl:  .  vitamin B-12 (CYANOCOBALAMIN) 100 MCG tablet, Take 100 mcg by mouth daily., Disp: , Rfl:    No Known Allergies   Objective:  Vascular Examination: Capillary  refill time immediate x 10 digits.  Dorsalis pedis pulses present b/l.  Posterior tibial pulses present b/l.  Digital hair absent x 10 digits.  Skin temperature gradient WNL  b/l  Dermatological Examination: Skin thin, shiny and atrophic b/l.  Toenails 1-5 b/l discolored, thick, dystrophic with subungual debris and pain with palpation to nailbeds due to thickness of nails.  Hyperkeratotic lesion, interdigital lateral 4th  PIPJ right foot, medial 5th digit DIPJ right foot, submet head 5 b/l with left foot having porokeratotic characteristics. No erythema, no edema, no drainage, no flocculence noted.  Small area of dry skin noted near sulcus of 4th digit left foot. No erythema, no edema, no drainage, no flocculence.  Musculoskeletal: Muscle strength 5/5 to all LE muscle groups.  Neurological: Sensation intact with 10 gram monofilament.  Vibratory sensation intact.  Assessment: 1. Painful onychomycosis toenails 1-5 b/l 2. Interdigital corn right 4th, right 5th 3. Callus submet head 5 right; Porokeratotic lesion submet head 5 left foot 4. NIDDM   Plan: 1. Toenails 1-5 b/l were debrided in length and girth without iatrogenic bleeding. 2. Corn(s) pared right 4th, 5th digits utilizing sterile scalpel blade without incident. Continue silicone padding for protection of interdigital corns. 3. Callus/porokeratotic lesion submet head 5 b/l pared submetatarsal head(s) utilizing sterile scalpel blade without incident.  4. Patient to continue soft, supportive shoe gear daily. 5. Patient to report any pedal injuries to medical  professional immediately. 6. Follow up 3 months. 7. Patient/POA to call should there be a concern in the interim.  

## 2019-05-30 DIAGNOSIS — C44622 Squamous cell carcinoma of skin of right upper limb, including shoulder: Secondary | ICD-10-CM | POA: Diagnosis not present

## 2019-05-30 DIAGNOSIS — D1801 Hemangioma of skin and subcutaneous tissue: Secondary | ICD-10-CM | POA: Diagnosis not present

## 2019-05-30 DIAGNOSIS — L570 Actinic keratosis: Secondary | ICD-10-CM | POA: Diagnosis not present

## 2019-05-30 DIAGNOSIS — Z85820 Personal history of malignant melanoma of skin: Secondary | ICD-10-CM | POA: Diagnosis not present

## 2019-05-30 DIAGNOSIS — Z85828 Personal history of other malignant neoplasm of skin: Secondary | ICD-10-CM | POA: Diagnosis not present

## 2019-05-30 DIAGNOSIS — L814 Other melanin hyperpigmentation: Secondary | ICD-10-CM | POA: Diagnosis not present

## 2019-05-30 DIAGNOSIS — D485 Neoplasm of uncertain behavior of skin: Secondary | ICD-10-CM | POA: Diagnosis not present

## 2019-05-30 DIAGNOSIS — C4441 Basal cell carcinoma of skin of scalp and neck: Secondary | ICD-10-CM | POA: Diagnosis not present

## 2019-05-30 DIAGNOSIS — L821 Other seborrheic keratosis: Secondary | ICD-10-CM | POA: Diagnosis not present

## 2019-06-01 DIAGNOSIS — E1169 Type 2 diabetes mellitus with other specified complication: Secondary | ICD-10-CM | POA: Diagnosis not present

## 2019-06-01 DIAGNOSIS — I10 Essential (primary) hypertension: Secondary | ICD-10-CM | POA: Diagnosis not present

## 2019-06-01 DIAGNOSIS — E785 Hyperlipidemia, unspecified: Secondary | ICD-10-CM | POA: Diagnosis not present

## 2019-06-01 DIAGNOSIS — E118 Type 2 diabetes mellitus with unspecified complications: Secondary | ICD-10-CM | POA: Diagnosis not present

## 2019-06-07 DIAGNOSIS — E785 Hyperlipidemia, unspecified: Secondary | ICD-10-CM | POA: Diagnosis not present

## 2019-06-07 DIAGNOSIS — C4492 Squamous cell carcinoma of skin, unspecified: Secondary | ICD-10-CM | POA: Diagnosis not present

## 2019-06-07 DIAGNOSIS — E1169 Type 2 diabetes mellitus with other specified complication: Secondary | ICD-10-CM | POA: Diagnosis not present

## 2019-06-07 DIAGNOSIS — I10 Essential (primary) hypertension: Secondary | ICD-10-CM | POA: Diagnosis not present

## 2019-06-14 DIAGNOSIS — C4441 Basal cell carcinoma of skin of scalp and neck: Secondary | ICD-10-CM | POA: Diagnosis not present

## 2019-06-14 DIAGNOSIS — Z85820 Personal history of malignant melanoma of skin: Secondary | ICD-10-CM | POA: Diagnosis not present

## 2019-06-14 DIAGNOSIS — C44622 Squamous cell carcinoma of skin of right upper limb, including shoulder: Secondary | ICD-10-CM | POA: Diagnosis not present

## 2019-06-14 DIAGNOSIS — Z85828 Personal history of other malignant neoplasm of skin: Secondary | ICD-10-CM | POA: Diagnosis not present

## 2019-07-12 DIAGNOSIS — E1169 Type 2 diabetes mellitus with other specified complication: Secondary | ICD-10-CM | POA: Diagnosis not present

## 2019-07-12 DIAGNOSIS — E785 Hyperlipidemia, unspecified: Secondary | ICD-10-CM | POA: Diagnosis not present

## 2019-07-12 DIAGNOSIS — Z7984 Long term (current) use of oral hypoglycemic drugs: Secondary | ICD-10-CM | POA: Diagnosis not present

## 2019-07-12 DIAGNOSIS — I10 Essential (primary) hypertension: Secondary | ICD-10-CM | POA: Diagnosis not present

## 2019-08-03 ENCOUNTER — Encounter: Payer: Self-pay | Admitting: Podiatry

## 2019-08-03 ENCOUNTER — Ambulatory Visit: Payer: HMO | Admitting: Podiatry

## 2019-08-03 ENCOUNTER — Other Ambulatory Visit: Payer: Self-pay

## 2019-08-03 VITALS — Temp 97.7°F

## 2019-08-03 DIAGNOSIS — B351 Tinea unguium: Secondary | ICD-10-CM

## 2019-08-03 DIAGNOSIS — M79674 Pain in right toe(s): Secondary | ICD-10-CM | POA: Diagnosis not present

## 2019-08-03 DIAGNOSIS — E119 Type 2 diabetes mellitus without complications: Secondary | ICD-10-CM

## 2019-08-03 DIAGNOSIS — M79675 Pain in left toe(s): Secondary | ICD-10-CM

## 2019-08-03 NOTE — Patient Instructions (Signed)
Corns and Calluses Corns are small areas of thickened skin that occur on the top, sides, or tip of a toe. They contain a cone-shaped core with a point that can press on a nerve below. This causes pain.  Calluses are areas of thickened skin that can occur anywhere on the body, including the hands, fingers, palms, soles of the feet, and heels. Calluses are usually larger than corns. What are the causes? Corns and calluses are caused by rubbing (friction) or pressure, such as from shoes that are too tight or do not fit properly. What increases the risk? Corns are more likely to develop in people who have misshapen toes (toe deformities), such as hammer toes. Calluses can occur with friction to any area of the skin. They are more likely to develop in people who:  Work with their hands.  Wear shoes that fit poorly, are too tight, or are high-heeled.  Have toe deformities. What are the signs or symptoms? Symptoms of a corn or callus include:  A hard growth on the skin.  Pain or tenderness under the skin.  Redness and swelling.  Increased discomfort while wearing tight-fitting shoes, if your feet are affected. If a corn or callus becomes infected, symptoms may include:  Redness and swelling that gets worse.  Pain.  Fluid, blood, or pus draining from the corn or callus. How is this diagnosed? Corns and calluses may be diagnosed based on your symptoms, your medical history, and a physical exam. How is this treated? Treatment for corns and calluses may include:  Removing the cause of the friction or pressure. This may involve: ? Changing your shoes. ? Wearing shoe inserts (orthotics) or other protective layers in your shoes, such as a corn pad. ? Wearing gloves.  Applying medicine to the skin (topical medicine) to help soften skin in the hardened, thickened areas.  Removing layers of dead skin with a file to reduce the size of the corn or callus.  Removing the corn or callus with a  scalpel or laser.  Taking antibiotic medicines, if your corn or callus is infected.  Having surgery, if a toe deformity is the cause. Follow these instructions at home:   Take over-the-counter and prescription medicines only as told by your health care provider.  If you were prescribed an antibiotic, take it as told by your health care provider. Do not stop taking it even if your condition starts to improve.  Wear shoes that fit well. Avoid wearing high-heeled shoes and shoes that are too tight or too loose.  Wear any padding, protective layers, gloves, or orthotics as told by your health care provider.  Soak your hands or feet and then use a file or pumice stone to soften your corn or callus. Do this as told by your health care provider.  Check your corn or callus every day for symptoms of infection. Contact a health care provider if you:  Notice that your symptoms do not improve with treatment.  Have redness or swelling that gets worse.  Notice that your corn or callus becomes painful.  Have fluid, blood, or pus coming from your corn or callus.  Have new symptoms. Summary  Corns are small areas of thickened skin that occur on the top, sides, or tip of a toe.  Calluses are areas of thickened skin that can occur anywhere on the body, including the hands, fingers, palms, and soles of the feet. Calluses are usually larger than corns.  Corns and calluses are caused by   rubbing (friction) or pressure, such as from shoes that are too tight or do not fit properly.  Treatment may include wearing any padding, protective layers, gloves, or orthotics as told by your health care provider. This information is not intended to replace advice given to you by your health care provider. Make sure you discuss any questions you have with your health care provider. Document Released: 09/11/2004 Document Revised: 03/28/2019 Document Reviewed: 10/19/2017 Elsevier Patient Education  [place]? An infection that lies within the keratin of your nail plate that is caused by a fungus.  WHY ME? Fungal infections affect all ages, sexes, races, and creeds.  There may be many factors that predispose you to a fungal infection such as age, coexisting medical conditions such as diabetes, or an autoimmune disease; stress, medications, fatigue, genetics, etc.  Bottom line: fungus thrives in a warm, moist environment and your shoes offer such a location.  IS IT CONTAGIOUS? Theoretically, yes.  You do not want to share shoes, nail clippers or files with someone who has fungal toenails.  Walking around barefoot in the same room or sleeping in the same bed is unlikely to transfer the organism.  It is important to realize, however, that fungus can spread easily from one nail to the next on the same foot.  HOW DO WE TREAT THIS?  There are several ways to treat this condition.  Treatment may depend on many factors such as age, medications, pregnancy, liver and kidney conditions, etc.  It is best to ask your doctor which options are available to you.  1. No treatment.   Unlike many other medical concerns, you can live with this condition.  However for many people this can be a painful condition and may lead to ingrown toenails or a bacterial infection.  It is recommended that you keep the nails cut short to help reduce the amount of fungal nail. 2. Topical treatment.  These range from herbal remedies to prescription strength nail lacquers.  About 40-50% effective, topicals require twice daily application for approximately 9 to 12 months or until an entirely new nail has grown out.  The most effective topicals are medical grade medications available through physicians offices. 3. Oral antifungal medications.  With an 80-90% cure rate, the most common oral medication requires 3 to 4 months of therapy and stays in your system for a year as the new nail grows out.   Oral antifungal medications do require blood work to make sure it is a safe drug for you.  A liver function panel will be performed prior to starting the medication and after the first month of treatment.  It is important to have the blood work performed to avoid any harmful side effects.  In general, this medication safe but blood work is required. 4. Laser Therapy.  This treatment is performed by applying a specialized laser to the affected nail plate.  This therapy is noninvasive, fast, and non-painful.  It is not covered by insurance and is therefore, out of pocket.  The results have been very good with a 80-95% cure rate.  The [facility] is the only practice in the area to offer this therapy. 5. Permanent Nail Avulsion.  Removing the entire nail so that a new nail will not grow back.

## 2019-08-14 NOTE — Progress Notes (Signed)
Subjective:  [name] presents to clinic today with cc of  painful, thick, discolored, elongated toenails 1-5 b/l that become tender and cannot cut because of thickness.  Pain is aggravated when wearing enclosed shoe gear.  She is receiving diabetic shoes and states that her calluses have resolved since wearing her diabetic shoes.  She is very pleased with them.   Current Outpatient Medications:  .  augmented betamethasone dipropionate (DIPROLENE-AF) 0.05 % cream, , Disp: , Rfl:  .  Blood Glucose Monitoring Suppl (ONE TOUCH ULTRA 2) w/Device KIT, U TO TEST BLOOD SUGAR UTD, Disp: , Rfl:  .  cholecalciferol (VITAMIN D) 1000 units tablet, Take 1,000 Units by mouth daily., Disp: , Rfl:  .  doxycycline (VIBRAMYCIN) 100 MG capsule, TK 1 C PO BID WF, Disp: , Rfl:  .  empagliflozin (JARDIANCE) 25 MG TABS tablet, Take 25 mg by mouth daily., Disp: , Rfl:  .  Lancets (ONETOUCH DELICA PLUS [id number]) MISC, USE AS DIRECTED USE FOR CHECKING BLOOD SUGARS ONCE DAILY, Disp: , Rfl:  .  lisinopril-hydrochlorothiazide (PRINZIDE,ZESTORETIC) 10-12.5 MG tablet, Take 1 tablet by mouth daily., Disp: , Rfl:  .  metFORMIN (GLUCOPHAGE-XR) 500 MG 24 hr tablet, Take 500 mg by mouth daily with breakfast. Take 4 tablets at one time during my evening meal; this equals 2000 mg every day, Disp: , Rfl:  .  Multiple Vitamin (MULTIVITAMIN) tablet, Take 1 tablet by mouth daily., Disp: , Rfl:  .  ONE TOUCH ULTRA TEST test strip, , Disp: , Rfl:  .  simvastatin (ZOCOR) 10 MG tablet, Take 10 mg by mouth daily., Disp: , Rfl:  .  valACYclovir (VALTREX) 1000 MG tablet, TK 1 T PO TID FOR 1 WK UTD, Disp: , Rfl:  .  vitamin B-12 (CYANOCOBALAMIN) 100 MCG tablet, Take 100 mcg by mouth daily., Disp: , Rfl:    No Known Allergies   Objective: Vitals:   08/03/19 0946  Temp: 97.7 F (36.5 C)    Physical Examination:  Vascular Examination: Capillary refill time immediate x 10 digits.  Palpable DP/PT pulses b/l.  Digital hair  absent b/l.  No edema noted b/l.  Skin temperature gradient WNL b/l.  Dermatological Examination: Skin is thin, shiny and atrophic b/l.  No open wounds b/l.  No interdigital macerations noted b/l.  Elongated, thick, discolored brittle toenails with subungual debris and pain on dorsal palpation of nailbeds 1-5 b/l.  There is nail spicule noted in the medial border of the right great toe.  She has a history of permanent partial nail avulsion of this toe but this border did not take.  Hyperkeratotic lesions resolved.   Musculoskeletal Examination: Muscle strength 5/5 to all muscle groups b/l  No pain, crepitus or joint discomfort with active/passive ROM.  Neurological Examination: Sensation intact 5/5 b/l with 10 gram monofilament.  Vibratory sensation intact b/l.  Proprioceptive sensation intact b/l.  Assessment:  Mycotic nail infection with pain 1-5 b/l  Plan: 1. Toenails 1-5 b/l were debrided in length and girth without iatrogenic laceration. 2.  Continue wearing her diabetic shoes daily. 3.  Report any pedal injuries to medical professional. 4.  Follow up 3 months. 5.  Patient/POA to call should there be a question/concern in there interim.

## 2019-08-15 DIAGNOSIS — E118 Type 2 diabetes mellitus with unspecified complications: Secondary | ICD-10-CM | POA: Diagnosis not present

## 2019-08-15 DIAGNOSIS — Z7984 Long term (current) use of oral hypoglycemic drugs: Secondary | ICD-10-CM | POA: Diagnosis not present

## 2019-08-15 DIAGNOSIS — I10 Essential (primary) hypertension: Secondary | ICD-10-CM | POA: Diagnosis not present

## 2019-08-15 DIAGNOSIS — E1169 Type 2 diabetes mellitus with other specified complication: Secondary | ICD-10-CM | POA: Diagnosis not present

## 2019-08-15 DIAGNOSIS — E785 Hyperlipidemia, unspecified: Secondary | ICD-10-CM | POA: Diagnosis not present

## 2019-08-22 DIAGNOSIS — E785 Hyperlipidemia, unspecified: Secondary | ICD-10-CM | POA: Diagnosis not present

## 2019-08-22 DIAGNOSIS — I10 Essential (primary) hypertension: Secondary | ICD-10-CM | POA: Diagnosis not present

## 2019-08-22 DIAGNOSIS — E118 Type 2 diabetes mellitus with unspecified complications: Secondary | ICD-10-CM | POA: Diagnosis not present

## 2019-08-22 DIAGNOSIS — E1169 Type 2 diabetes mellitus with other specified complication: Secondary | ICD-10-CM | POA: Diagnosis not present

## 2019-09-10 DIAGNOSIS — Z23 Encounter for immunization: Secondary | ICD-10-CM | POA: Diagnosis not present

## 2019-09-10 DIAGNOSIS — E1169 Type 2 diabetes mellitus with other specified complication: Secondary | ICD-10-CM | POA: Diagnosis not present

## 2019-09-10 DIAGNOSIS — Z1389 Encounter for screening for other disorder: Secondary | ICD-10-CM | POA: Diagnosis not present

## 2019-09-10 DIAGNOSIS — Z0000 Encounter for general adult medical examination without abnormal findings: Secondary | ICD-10-CM | POA: Diagnosis not present

## 2019-09-10 DIAGNOSIS — E785 Hyperlipidemia, unspecified: Secondary | ICD-10-CM | POA: Diagnosis not present

## 2019-09-10 DIAGNOSIS — Z135 Encounter for screening for eye and ear disorders: Secondary | ICD-10-CM | POA: Diagnosis not present

## 2019-09-10 DIAGNOSIS — E119 Type 2 diabetes mellitus without complications: Secondary | ICD-10-CM | POA: Diagnosis not present

## 2019-09-10 DIAGNOSIS — I10 Essential (primary) hypertension: Secondary | ICD-10-CM | POA: Diagnosis not present

## 2019-09-10 DIAGNOSIS — Z7984 Long term (current) use of oral hypoglycemic drugs: Secondary | ICD-10-CM | POA: Diagnosis not present

## 2019-10-10 DIAGNOSIS — I10 Essential (primary) hypertension: Secondary | ICD-10-CM | POA: Diagnosis not present

## 2019-10-10 DIAGNOSIS — E785 Hyperlipidemia, unspecified: Secondary | ICD-10-CM | POA: Diagnosis not present

## 2019-10-10 DIAGNOSIS — Z7984 Long term (current) use of oral hypoglycemic drugs: Secondary | ICD-10-CM | POA: Diagnosis not present

## 2019-10-10 DIAGNOSIS — E118 Type 2 diabetes mellitus with unspecified complications: Secondary | ICD-10-CM | POA: Diagnosis not present

## 2019-11-02 ENCOUNTER — Ambulatory Visit: Payer: HMO | Admitting: Podiatry

## 2019-11-12 DIAGNOSIS — E785 Hyperlipidemia, unspecified: Secondary | ICD-10-CM | POA: Diagnosis not present

## 2019-11-12 DIAGNOSIS — E118 Type 2 diabetes mellitus with unspecified complications: Secondary | ICD-10-CM | POA: Diagnosis not present

## 2019-11-12 DIAGNOSIS — I10 Essential (primary) hypertension: Secondary | ICD-10-CM | POA: Diagnosis not present

## 2019-12-04 ENCOUNTER — Other Ambulatory Visit: Payer: Self-pay

## 2019-12-04 NOTE — Patient Outreach (Signed)
  [place] [facility]) Care Management Chronic Special Needs Program  12/04/2019  Name: [name] DOB: 02-15-1951  MRN: [id number]  Ms. [name] is enrolled in a Chronic Special Needs Plan. RNCM called to follow up and review individualized care plan. No answer. HIPPA compliant message left.   Plan: Chronic care management coordinator will attempt outreach within 2-3 weeks.  Thea Silversmith, RN, MSN, [place] [phone]

## 2019-12-17 ENCOUNTER — Other Ambulatory Visit: Payer: Self-pay

## 2019-12-17 NOTE — Patient Outreach (Signed)
  [place] [facility]) Care Management Chronic Special Needs Program  12/17/2019  Name: [name] DOB: 09-Dec-1951  MRN: [id number]  [name] is enrolled in a Chronic Special Needs Plan. RNCM called  To follow up. No answer. HIPPA compliant message left. 2nd  Outreach attempt.  Plan: Chronic care management coordinator will attempt outreach within 2-3 weeks.  Thea Silversmith, RN, MSN, [place] [phone]

## 2019-12-18 ENCOUNTER — Other Ambulatory Visit: Payer: Self-pay

## 2019-12-18 NOTE — Patient Outreach (Signed)
  [place] [facility]) Care Management Chronic Special Needs Program  12/18/2019  Name: BEOLA VOLMAR DOB: 11-Apr-1951  MRN: [id number]  Ms. Suliana Obregon is enrolled in a chronic special needs plan for Diabetes. Reviewed and updated care plan.  Subjective: Client reports she is doing well. She states she attends provider visits as scheduled. Denies any difficulty obtaining medications. She states last A1C around 7.3. blood sugar this morning was 145 before meals. She reports she remains active and exercises 2-3 times/week. She denies any questions or concerns.  Goals Addressed            This Visit's Progress   . COMPLETED:  Acknowledge receipt of Advanced Directive package       Voiced receipt of advanced directive packet.    . COMPLETED: Client understands the importance of follow-up with providers by attending scheduled visits       Voiced importance of attending provider visits.    . COMPLETED: Client will use Assistive Devices as needed and verbalize understanding of device use       Denies any problems with use of glucometer.    . COMPLETED: Client will verbalize knowledge of self management of Hypertension as evidences by BP reading of 140/90 or less; or as defined by provider       Follows up with provider as scheduled, taking medications as prescribed, monitors salt intake. Reports blood pressure less than 140/90.    Marland Kitchen COMPLETED: Maintain timely refills of diabetic medication as prescribed within the year .       Denies any difficulty obtaining medications.    . Obtain annual  Lipid Profile, LDL-C   On track    Done 09/10/2019    . COMPLETED: Obtain Annual Eye (retinal)  Exam        done 09/10/2019    . COMPLETED: Obtain Annual Foot Exam       Saw Foot doctor  February 2020, May 2020, August 2020 Sensation exam done in September 2020.    Marland Kitchen COMPLETED: Obtain annual screen for micro albuminuria (urine) , nephropathy (kidney problems)       Done 09/10/2019    . Obtain Hemoglobin A1C at least 2 times per year   On track    Done at least twice/year March 2020; June 2020; July 2020 per house nurse 7.0 and September 2020 A1C 7.3    . COMPLETED: Visit Primary Care Provider or Endocrinologist at least 2 times per year        Reports has seen at least twice this year. March/April 2020; June 2020; September 2020.      Covid 19 precautions discussed, encouraged client to call 24 hour nurse advice line as needed, reinforced health care concierge is available for benefits questions, encouraged client to call RNCM as needed.  Plan: RNCM will send updated care plan to client; send updated care plan to primary care. RNCM will follow up per tier level in 9-12 months.    Thea Silversmith, RN, MSN, [place] [phone]   .

## 2019-12-19 DIAGNOSIS — E1169 Type 2 diabetes mellitus with other specified complication: Secondary | ICD-10-CM | POA: Diagnosis not present

## 2019-12-19 DIAGNOSIS — I10 Essential (primary) hypertension: Secondary | ICD-10-CM | POA: Diagnosis not present

## 2019-12-19 DIAGNOSIS — E118 Type 2 diabetes mellitus with unspecified complications: Secondary | ICD-10-CM | POA: Diagnosis not present

## 2019-12-19 DIAGNOSIS — E785 Hyperlipidemia, unspecified: Secondary | ICD-10-CM | POA: Diagnosis not present

## 2019-12-25 ENCOUNTER — Ambulatory Visit: Payer: Self-pay

## 2019-12-25 DIAGNOSIS — E118 Type 2 diabetes mellitus with unspecified complications: Secondary | ICD-10-CM | POA: Diagnosis not present

## 2019-12-25 DIAGNOSIS — I10 Essential (primary) hypertension: Secondary | ICD-10-CM | POA: Diagnosis not present

## 2019-12-25 DIAGNOSIS — E1169 Type 2 diabetes mellitus with other specified complication: Secondary | ICD-10-CM | POA: Diagnosis not present

## 2019-12-25 DIAGNOSIS — Z7984 Long term (current) use of oral hypoglycemic drugs: Secondary | ICD-10-CM | POA: Diagnosis not present

## 2019-12-25 DIAGNOSIS — E785 Hyperlipidemia, unspecified: Secondary | ICD-10-CM | POA: Diagnosis not present

## 2020-01-15 DIAGNOSIS — Z85828 Personal history of other malignant neoplasm of skin: Secondary | ICD-10-CM | POA: Diagnosis not present

## 2020-01-15 DIAGNOSIS — L570 Actinic keratosis: Secondary | ICD-10-CM | POA: Diagnosis not present

## 2020-01-15 DIAGNOSIS — Z85820 Personal history of malignant melanoma of skin: Secondary | ICD-10-CM | POA: Diagnosis not present

## 2020-01-15 DIAGNOSIS — D224 Melanocytic nevi of scalp and neck: Secondary | ICD-10-CM | POA: Diagnosis not present

## 2020-01-15 DIAGNOSIS — L821 Other seborrheic keratosis: Secondary | ICD-10-CM | POA: Diagnosis not present

## 2020-01-15 DIAGNOSIS — D485 Neoplasm of uncertain behavior of skin: Secondary | ICD-10-CM | POA: Diagnosis not present

## 2020-01-23 ENCOUNTER — Ambulatory Visit: Payer: HMO | Admitting: Podiatry

## 2020-02-15 DIAGNOSIS — E1169 Type 2 diabetes mellitus with other specified complication: Secondary | ICD-10-CM | POA: Diagnosis not present

## 2020-02-15 DIAGNOSIS — I10 Essential (primary) hypertension: Secondary | ICD-10-CM | POA: Diagnosis not present

## 2020-02-15 DIAGNOSIS — Z7984 Long term (current) use of oral hypoglycemic drugs: Secondary | ICD-10-CM | POA: Diagnosis not present

## 2020-02-15 DIAGNOSIS — E118 Type 2 diabetes mellitus with unspecified complications: Secondary | ICD-10-CM | POA: Diagnosis not present

## 2020-02-15 DIAGNOSIS — E785 Hyperlipidemia, unspecified: Secondary | ICD-10-CM | POA: Diagnosis not present

## 2020-02-21 ENCOUNTER — Other Ambulatory Visit: Payer: Self-pay | Admitting: Internal Medicine

## 2020-02-21 DIAGNOSIS — E1169 Type 2 diabetes mellitus with other specified complication: Secondary | ICD-10-CM | POA: Diagnosis not present

## 2020-02-21 DIAGNOSIS — Z1231 Encounter for screening mammogram for malignant neoplasm of breast: Secondary | ICD-10-CM | POA: Diagnosis not present

## 2020-02-21 DIAGNOSIS — I10 Essential (primary) hypertension: Secondary | ICD-10-CM | POA: Diagnosis not present

## 2020-02-21 DIAGNOSIS — H538 Other visual disturbances: Secondary | ICD-10-CM | POA: Diagnosis not present

## 2020-02-21 DIAGNOSIS — E118 Type 2 diabetes mellitus with unspecified complications: Secondary | ICD-10-CM | POA: Diagnosis not present

## 2020-02-21 DIAGNOSIS — E785 Hyperlipidemia, unspecified: Secondary | ICD-10-CM | POA: Diagnosis not present

## 2020-02-21 DIAGNOSIS — Z7984 Long term (current) use of oral hypoglycemic drugs: Secondary | ICD-10-CM | POA: Diagnosis not present

## 2020-02-26 ENCOUNTER — Encounter: Payer: Self-pay | Admitting: Podiatry

## 2020-02-26 ENCOUNTER — Ambulatory Visit (INDEPENDENT_AMBULATORY_CARE_PROVIDER_SITE_OTHER): Payer: HMO | Admitting: Podiatry

## 2020-02-26 ENCOUNTER — Other Ambulatory Visit: Payer: Self-pay

## 2020-02-26 DIAGNOSIS — L84 Corns and callosities: Secondary | ICD-10-CM

## 2020-02-26 DIAGNOSIS — M79674 Pain in right toe(s): Secondary | ICD-10-CM | POA: Diagnosis not present

## 2020-02-26 DIAGNOSIS — M79675 Pain in left toe(s): Secondary | ICD-10-CM | POA: Diagnosis not present

## 2020-02-26 DIAGNOSIS — B351 Tinea unguium: Secondary | ICD-10-CM

## 2020-02-26 DIAGNOSIS — E119 Type 2 diabetes mellitus without complications: Secondary | ICD-10-CM | POA: Diagnosis not present

## 2020-02-26 NOTE — Progress Notes (Signed)
Subjective: [name] presents today for follow up of preventative diabetic foot care and callus(es) plantar aspect left foot and painful mycotic toenails b/l that are difficult to trim. Pain interferes with ambulation. Aggravating factors include wearing enclosed shoe gear. Pain is relieved with periodic professional debridement.   She states callus left foot has returned. She is also inquiring about new diabetic shoes on today's visit. She voices no other pedal concerns on today's visit.  No Known Allergies   Objective: There were no vitals filed for this visit.  Pt 69 y.o. year old Caucasian female WD, WN in NAD. AAO x 3.   Vascular Examination:  Capillary refill time to digits immediate b/l. Palpable DP pulses b/l. Palpable PT pulses b/l. Pedal hair absent b/l Skin temperature gradient within normal limits b/l.  Dermatological Examination: Pedal skin is thin shiny, atrophic bilaterally. No open wounds bilaterally. No interdigital macerations bilaterally. Toenails 1-5 b/l elongated, dystrophic, thickened, crumbly with subungual debris and tenderness to dorsal palpation. Hyperkeratotic lesion(s) submet head 5 left foot.  No erythema, no edema, no drainage, no flocculence.  Musculoskeletal: Normal muscle strength 5/5 to all lower extremity muscle groups bilaterally, no pain crepitus or joint limitation noted with ROM b/l, hammertoes noted to the  2-5 bilaterally, wearing [employer] shoe gear and patient ambulates independent of any assistive aids  Neurological: Protective sensation intact 5/5 intact bilaterally with 10g monofilament b/l Vibratory sensation intact b/l  Assessment: 1. Pain due to onychomycosis of toenails of both feet   2. Callus   3. Controlled type 2 diabetes mellitus without complication, without long-term current use of insulin ([place])    Plan: -Continue diabetic foot care principles. Literature dispensed on today.  -Toenails 1-5 b/l were debrided in length and  girth with sterile nail nippers and dremel without iatrogenic bleeding.  -Callus submet head 5 left foot debrided without complication or incident. Total number debrided =1. -Patient to continue soft, supportive shoe gear daily. Start procedure for diabetic shoes. Patient qualifies based on diagnoses. -Patient to report any pedal injuries to medical professional immediately. -Patient/POA to call should there be question/concern in the interim.  Return in about 3 months (around 05/28/2020) for diabetic nail trim.

## 2020-02-26 NOTE — Patient Instructions (Signed)
Diabetes Mellitus and Foot Care Foot care is an important part of your health, especially when you have diabetes. Diabetes may cause you to have problems because of poor blood flow (circulation) to your feet and legs, which can cause your skin to:  Become thinner and drier.  Break more easily.  Heal more slowly.  Peel and crack. You may also have nerve damage (neuropathy) in your legs and feet, causing decreased feeling in them. This means that you may not notice minor injuries to your feet that could lead to more serious problems. Noticing and addressing any potential problems early is the best way to prevent future foot problems. How to care for your feet Foot hygiene  Wash your feet daily with warm water and mild soap. Do not use hot water. Then, pat your feet and the areas between your toes until they are completely dry. Do not soak your feet as this can dry your skin.  Trim your toenails straight across. Do not dig under them or around the cuticle. File the edges of your nails with an emery board or nail file.  Apply a moisturizing lotion or petroleum jelly to the skin on your feet and to dry, brittle toenails. Use lotion that does not contain alcohol and is unscented. Do not apply lotion between your toes. Shoes and socks  Wear clean socks or stockings every day. Make sure they are not too tight. Do not wear knee-high stockings since they may decrease blood flow to your legs.  Wear shoes that fit properly and have enough cushioning. Always look in your shoes before you put them on to be sure there are no objects inside.  To break in new shoes, wear them for just a few hours a day. This prevents injuries on your feet. Wounds, scrapes, corns, and calluses  Check your feet daily for blisters, cuts, bruises, sores, and redness. If you cannot see the bottom of your feet, use a mirror or ask someone for help.  Do not cut corns or calluses or try to remove them with medicine.  If you  find a minor scrape, cut, or break in the skin on your feet, keep it and the skin around it clean and dry. You may clean these areas with mild soap and water. Do not clean the area with peroxide, alcohol, or iodine.  If you have a wound, scrape, corn, or callus on your foot, look at it several times a day to make sure it is healing and not infected. Check for: ? Redness, swelling, or pain. ? Fluid or blood. ? Warmth. ? Pus or a bad smell. General instructions  Do not cross your legs. This may decrease blood flow to your feet.  Do not use heating pads or hot water bottles on your feet. They may burn your skin. If you have lost feeling in your feet or legs, you may not know this is happening until it is too late.  Protect your feet from hot and cold by wearing shoes, such as at the beach or on hot pavement.  Schedule a complete foot exam at least once a year (annually) or more often if you have foot problems. If you have foot problems, report any cuts, sores, or bruises to your health care provider immediately. Contact a health care provider if:  You have a medical condition that increases your risk of infection and you have any cuts, sores, or bruises on your feet.  You have an injury that is not   healing.  You have redness on your legs or feet.  You feel burning or tingling in your legs or feet.  You have pain or cramps in your legs and feet.  Your legs or feet are numb.  Your feet always feel cold.  You have pain around a toenail. Get help right away if:  You have a wound, scrape, corn, or callus on your foot and: ? You have pain, swelling, or redness that gets worse. ? You have fluid or blood coming from the wound, scrape, corn, or callus. ? Your wound, scrape, corn, or callus feels warm to the touch. ? You have pus or a bad smell coming from the wound, scrape, corn, or callus. ? You have a fever. ? You have a red line going up your leg. Summary  Check your feet every day  for cuts, sores, red spots, swelling, and blisters.  Moisturize feet and legs daily.  Wear shoes that fit properly and have enough cushioning.  If you have foot problems, report any cuts, sores, or bruises to your health care provider immediately.  Schedule a complete foot exam at least once a year (annually) or more often if you have foot problems. This information is not intended to replace advice given to you by your health care provider. Make sure you discuss any questions you have with your health care provider. Document Revised: 08/29/2019 Document Reviewed: 01/07/2017 Elsevier Patient Education  2020 Elsevier Inc.  

## 2020-03-06 ENCOUNTER — Other Ambulatory Visit: Payer: Self-pay

## 2020-03-06 ENCOUNTER — Other Ambulatory Visit: Payer: Self-pay | Admitting: Internal Medicine

## 2020-03-06 ENCOUNTER — Ambulatory Visit
Admission: RE | Admit: 2020-03-06 | Discharge: 2020-03-06 | Disposition: A | Discharge status: Home / Self Care | Payer: HMO | Source: Ambulatory Visit | Attending: Internal Medicine | Admitting: Internal Medicine

## 2020-03-06 DIAGNOSIS — M25551 Pain in right hip: Secondary | ICD-10-CM

## 2020-03-06 DIAGNOSIS — S79911A Unspecified injury of right hip, initial encounter: Secondary | ICD-10-CM | POA: Diagnosis not present

## 2020-03-06 DIAGNOSIS — W19XXXA Unspecified fall, initial encounter: Secondary | ICD-10-CM | POA: Diagnosis not present

## 2020-03-27 ENCOUNTER — Other Ambulatory Visit: Payer: Self-pay | Admitting: Internal Medicine

## 2020-03-27 DIAGNOSIS — Z780 Asymptomatic menopausal state: Secondary | ICD-10-CM | POA: Diagnosis not present

## 2020-03-27 DIAGNOSIS — W19XXXA Unspecified fall, initial encounter: Secondary | ICD-10-CM | POA: Diagnosis not present

## 2020-03-27 DIAGNOSIS — M25551 Pain in right hip: Secondary | ICD-10-CM | POA: Diagnosis not present

## 2020-04-07 DIAGNOSIS — Z7984 Long term (current) use of oral hypoglycemic drugs: Secondary | ICD-10-CM | POA: Diagnosis not present

## 2020-04-07 DIAGNOSIS — E785 Hyperlipidemia, unspecified: Secondary | ICD-10-CM | POA: Diagnosis not present

## 2020-04-07 DIAGNOSIS — I10 Essential (primary) hypertension: Secondary | ICD-10-CM | POA: Diagnosis not present

## 2020-04-07 DIAGNOSIS — E118 Type 2 diabetes mellitus with unspecified complications: Secondary | ICD-10-CM | POA: Diagnosis not present

## 2020-04-07 DIAGNOSIS — E1169 Type 2 diabetes mellitus with other specified complication: Secondary | ICD-10-CM | POA: Diagnosis not present

## 2020-04-21 ENCOUNTER — Ambulatory Visit: Payer: HMO

## 2020-05-01 ENCOUNTER — Other Ambulatory Visit: Payer: Self-pay | Admitting: Internal Medicine

## 2020-05-01 DIAGNOSIS — Z780 Asymptomatic menopausal state: Secondary | ICD-10-CM

## 2020-05-02 ENCOUNTER — Ambulatory Visit: Payer: HMO | Admitting: Orthotics

## 2020-05-02 ENCOUNTER — Other Ambulatory Visit: Payer: Self-pay

## 2020-05-05 ENCOUNTER — Ambulatory Visit
Admission: RE | Admit: 2020-05-05 | Discharge: 2020-05-05 | Disposition: A | Discharge status: Home / Self Care | Payer: HMO | Source: Ambulatory Visit | Attending: Internal Medicine | Admitting: Internal Medicine

## 2020-05-05 ENCOUNTER — Other Ambulatory Visit: Payer: Self-pay

## 2020-05-05 DIAGNOSIS — Z780 Asymptomatic menopausal state: Secondary | ICD-10-CM | POA: Diagnosis not present

## 2020-05-05 DIAGNOSIS — Z13820 Encounter for screening for osteoporosis: Secondary | ICD-10-CM | POA: Diagnosis not present

## 2020-05-05 DIAGNOSIS — Z1231 Encounter for screening mammogram for malignant neoplasm of breast: Secondary | ICD-10-CM | POA: Diagnosis not present

## 2020-05-13 DIAGNOSIS — E118 Type 2 diabetes mellitus with unspecified complications: Secondary | ICD-10-CM | POA: Diagnosis not present

## 2020-05-13 DIAGNOSIS — I10 Essential (primary) hypertension: Secondary | ICD-10-CM | POA: Diagnosis not present

## 2020-05-13 DIAGNOSIS — E1169 Type 2 diabetes mellitus with other specified complication: Secondary | ICD-10-CM | POA: Diagnosis not present

## 2020-05-13 DIAGNOSIS — E785 Hyperlipidemia, unspecified: Secondary | ICD-10-CM | POA: Diagnosis not present

## 2020-05-27 ENCOUNTER — Other Ambulatory Visit: Payer: Self-pay

## 2020-05-27 ENCOUNTER — Encounter: Payer: Self-pay | Admitting: Podiatry

## 2020-05-27 ENCOUNTER — Ambulatory Visit: Payer: HMO | Admitting: Podiatry

## 2020-05-27 DIAGNOSIS — S91114A Laceration without foreign body of right lesser toe(s) without damage to nail, initial encounter: Secondary | ICD-10-CM

## 2020-05-27 DIAGNOSIS — B351 Tinea unguium: Secondary | ICD-10-CM | POA: Diagnosis not present

## 2020-05-27 DIAGNOSIS — M79674 Pain in right toe(s): Secondary | ICD-10-CM

## 2020-05-27 DIAGNOSIS — M79675 Pain in left toe(s): Secondary | ICD-10-CM

## 2020-05-27 DIAGNOSIS — L84 Corns and callosities: Secondary | ICD-10-CM

## 2020-05-27 DIAGNOSIS — E119 Type 2 diabetes mellitus without complications: Secondary | ICD-10-CM

## 2020-05-27 NOTE — Patient Instructions (Signed)
Diabetes Mellitus and Foot Care Foot care is an important part of your health, especially when you have diabetes. Diabetes may cause you to have problems because of poor blood flow (circulation) to your feet and legs, which can cause your skin to:  Become thinner and drier.  Break more easily.  Heal more slowly.  Peel and crack. You may also have nerve damage (neuropathy) in your legs and feet, causing decreased feeling in them. This means that you may not notice minor injuries to your feet that could lead to more serious problems. Noticing and addressing any potential problems early is the best way to prevent future foot problems. How to care for your feet Foot hygiene  Wash your feet daily with warm water and mild soap. Do not use hot water. Then, pat your feet and the areas between your toes until they are completely dry. Do not soak your feet as this can dry your skin.  Trim your toenails straight across. Do not dig under them or around the cuticle. File the edges of your nails with an emery board or nail file.  Apply a moisturizing lotion or petroleum jelly to the skin on your feet and to dry, brittle toenails. Use lotion that does not contain alcohol and is unscented. Do not apply lotion between your toes. Shoes and socks  Wear clean socks or stockings every day. Make sure they are not too tight. Do not wear knee-high stockings since they may decrease blood flow to your legs.  Wear shoes that fit properly and have enough cushioning. Always look in your shoes before you put them on to be sure there are no objects inside.  To break in new shoes, wear them for just a few hours a day. This prevents injuries on your feet. Wounds, scrapes, corns, and calluses  Check your feet daily for blisters, cuts, bruises, sores, and redness. If you cannot see the bottom of your feet, use a mirror or ask someone for help.  Do not cut corns or calluses or try to remove them with medicine.  If you  find a minor scrape, cut, or break in the skin on your feet, keep it and the skin around it clean and dry. You may clean these areas with mild soap and water. Do not clean the area with peroxide, alcohol, or iodine.  If you have a wound, scrape, corn, or callus on your foot, look at it several times a day to make sure it is healing and not infected. Check for: ? Redness, swelling, or pain. ? Fluid or blood. ? Warmth. ? Pus or a bad smell. General instructions  Do not cross your legs. This may decrease blood flow to your feet.  Do not use heating pads or hot water bottles on your feet. They may burn your skin. If you have lost feeling in your feet or legs, you may not know this is happening until it is too late.  Protect your feet from hot and cold by wearing shoes, such as at the beach or on hot pavement.  Schedule a complete foot exam at least once a year (annually) or more often if you have foot problems. If you have foot problems, report any cuts, sores, or bruises to your health care provider immediately. Contact a health care provider if:  You have a medical condition that increases your risk of infection and you have any cuts, sores, or bruises on your feet.  You have an injury that is not   healing.  You have redness on your legs or feet.  You feel burning or tingling in your legs or feet.  You have pain or cramps in your legs and feet.  Your legs or feet are numb.  Your feet always feel cold.  You have pain around a toenail. Get help right away if:  You have a wound, scrape, corn, or callus on your foot and: ? You have pain, swelling, or redness that gets worse. ? You have fluid or blood coming from the wound, scrape, corn, or callus. ? Your wound, scrape, corn, or callus feels warm to the touch. ? You have pus or a bad smell coming from the wound, scrape, corn, or callus. ? You have a fever. ? You have a red line going up your leg. Summary  Check your feet every day  for cuts, sores, red spots, swelling, and blisters.  Moisturize feet and legs daily.  Wear shoes that fit properly and have enough cushioning.  If you have foot problems, report any cuts, sores, or bruises to your health care provider immediately.  Schedule a complete foot exam at least once a year (annually) or more often if you have foot problems. This information is not intended to replace advice given to you by your health care provider. Make sure you discuss any questions you have with your health care provider. Document Revised: 08/29/2019 Document Reviewed: 01/07/2017 Elsevier Patient Education  2020 Elsevier Inc.  

## 2020-05-30 NOTE — Progress Notes (Signed)
Subjective: [name] presents today preventative diabetic foot care and painful mycotic nails b/l that are difficult to trim. Pain interferes with ambulation. Aggravating factors include wearing enclosed shoe gear. Pain is relieved with periodic professional debridement.   Patient states her right 4th digit toenail broke off after it got caught in her sock. It broke off close to her skin and bled a little bit.  Leeroy Cha, MD is patient's PCP. Last visit was: 12/19/2019.  Past Medical History:  Diagnosis Date  . Diabetes mellitus without complication ([place])   . Hypertension      Current Outpatient Medications on File Prior to Visit  Medication Sig Dispense Refill  . augmented betamethasone dipropionate (DIPROLENE-AF) 0.05 % cream     . Blood Glucose Monitoring Suppl (ONE TOUCH ULTRA 2) w/Device KIT U TO TEST BLOOD SUGAR UTD    . cholecalciferol (VITAMIN D) 1000 units tablet Take 1,000 Units by mouth daily.    Marland Kitchen doxycycline (VIBRAMYCIN) 100 MG capsule TK 1 C PO BID WF    . empagliflozin (JARDIANCE) 25 MG TABS tablet Take 25 mg by mouth daily.    . Lancets (ONETOUCH DELICA PLUS [id number]) MISC USE AS DIRECTED USE FOR CHECKING BLOOD SUGARS ONCE DAILY    . lisinopril-hydrochlorothiazide (PRINZIDE,ZESTORETIC) 10-12.5 MG tablet Take 1 tablet by mouth daily.    . metFORMIN (GLUCOPHAGE-XR) 500 MG 24 hr tablet Take 500 mg by mouth daily with breakfast. Take 4 tablets at one time during my evening meal; this equals 2000 mg every day    . Multiple Vitamin (MULTIVITAMIN) tablet Take 1 tablet by mouth daily.    . ONE TOUCH ULTRA TEST test strip     . predniSONE (STERAPRED UNI-PAK 21 TAB) 10 MG (21) TBPK tablet TAKE AS DIRECTED FOR 6 DAYS    . simvastatin (ZOCOR) 10 MG tablet Take 10 mg by mouth daily.    . valACYclovir (VALTREX) 1000 MG tablet TK 1 T PO TID FOR 1 WK UTD    . vitamin B-12 (CYANOCOBALAMIN) 100 MCG tablet Take 100 mcg by mouth daily.     No current  facility-administered medications on file prior to visit.     No Known Allergies  Objective: [name] is a pleasant 69 y.o. y.o. Patient Race: White or Caucasian [1]  female in NAD. AAO x 3.  There were no vitals filed for this visit.  Vascular Examination: Neurovascular status unchanged b/l lower extremities. Capillary refill time to digits immediate b/l. Palpable DP pulses b/l. Palpable PT pulses b/l. Pedal hair absent b/l. Skin temperature gradient within normal limits b/l.  Dermatological Examination: Pedal skin is thin shiny, atrophic bilaterally. No open wounds bilaterally. No interdigital macerations bilaterally. Toenails 1-5 left, R hallux, R 2nd toe, R 3rd toe and R 5th toe elongated, discolored, dystrophic, thickened, and crumbly with subungual debris and tenderness to dorsal palpation. Hyperkeratotic lesion(s) submet head 5 left foot.  No erythema, no edema, no drainage, no flocculence.   Superficial laceration noted at distal free edge of right 4th toe nailplate with tenderness to palpation. No active bleeding. No erythema, no edema, no drainage, no flocculence. Slight tenderness to palpation.  Musculoskeletal: Normal muscle strength 5/5 to all lower extremity muscle groups bilaterally. No pain crepitus or joint limitation noted with ROM b/l. Hammertoes noted to the 2-5 bilaterally. Wearing appropriate fitting shoe gear.  Neurological Examination: Protective sensation intact 5/5 intact bilaterally with 10g monofilament b/l. Vibratory sensation intact b/l. Proprioception intact bilaterally.  Assessment: 1. Pain due  to onychomycosis of toenails of both feet   2. Callus   3. Laceration of lesser toe of right foot without foreign body present or damage to nail, initial encounter   4. Controlled type 2 diabetes mellitus without complication, without long-term current use of insulin ([facility])    Plan: -Examined patient. -Toenails 1-5 left, R hallux, R 2nd toe, R 3rd toe and  R 5th toe debrided in length and girth without iatrogenic bleeding with sterile nail nipper and dremel. Right 4th digit cleansed with alcohol. Triple antibiotic ointment applied to digit. She was instructed to apply Neosporin to right 4th digit once daily until healed. -Patient to report any pedal injuries to medical professional immediately. -Patient to continue soft, supportive shoe gear daily. -Patient/POA to call should there be question/concern in the interim.  Return in about 3 months (around 08/27/2020) for diabetic nail and callus trim.  Marzetta Board, DPM

## 2020-06-18 DIAGNOSIS — I10 Essential (primary) hypertension: Secondary | ICD-10-CM | POA: Diagnosis not present

## 2020-06-18 DIAGNOSIS — E1169 Type 2 diabetes mellitus with other specified complication: Secondary | ICD-10-CM | POA: Diagnosis not present

## 2020-06-18 DIAGNOSIS — E785 Hyperlipidemia, unspecified: Secondary | ICD-10-CM | POA: Diagnosis not present

## 2020-06-18 DIAGNOSIS — E118 Type 2 diabetes mellitus with unspecified complications: Secondary | ICD-10-CM | POA: Diagnosis not present

## 2020-07-15 DIAGNOSIS — L570 Actinic keratosis: Secondary | ICD-10-CM | POA: Diagnosis not present

## 2020-07-15 DIAGNOSIS — Z85820 Personal history of malignant melanoma of skin: Secondary | ICD-10-CM | POA: Diagnosis not present

## 2020-07-15 DIAGNOSIS — L578 Other skin changes due to chronic exposure to nonionizing radiation: Secondary | ICD-10-CM | POA: Diagnosis not present

## 2020-07-15 DIAGNOSIS — L918 Other hypertrophic disorders of the skin: Secondary | ICD-10-CM | POA: Diagnosis not present

## 2020-07-15 DIAGNOSIS — Z85828 Personal history of other malignant neoplasm of skin: Secondary | ICD-10-CM | POA: Diagnosis not present

## 2020-07-15 DIAGNOSIS — L821 Other seborrheic keratosis: Secondary | ICD-10-CM | POA: Diagnosis not present

## 2020-07-15 DIAGNOSIS — L814 Other melanin hyperpigmentation: Secondary | ICD-10-CM | POA: Diagnosis not present

## 2020-07-15 DIAGNOSIS — D1801 Hemangioma of skin and subcutaneous tissue: Secondary | ICD-10-CM | POA: Diagnosis not present

## 2020-08-13 DIAGNOSIS — E785 Hyperlipidemia, unspecified: Secondary | ICD-10-CM | POA: Diagnosis not present

## 2020-08-13 DIAGNOSIS — E118 Type 2 diabetes mellitus with unspecified complications: Secondary | ICD-10-CM | POA: Diagnosis not present

## 2020-08-13 DIAGNOSIS — I10 Essential (primary) hypertension: Secondary | ICD-10-CM | POA: Diagnosis not present

## 2020-08-13 DIAGNOSIS — E1169 Type 2 diabetes mellitus with other specified complication: Secondary | ICD-10-CM | POA: Diagnosis not present

## 2020-09-03 ENCOUNTER — Other Ambulatory Visit: Payer: Self-pay

## 2020-09-03 ENCOUNTER — Ambulatory Visit (INDEPENDENT_AMBULATORY_CARE_PROVIDER_SITE_OTHER): Payer: HMO | Admitting: Podiatry

## 2020-09-03 ENCOUNTER — Encounter: Payer: Self-pay | Admitting: Podiatry

## 2020-09-03 DIAGNOSIS — E119 Type 2 diabetes mellitus without complications: Secondary | ICD-10-CM

## 2020-09-03 DIAGNOSIS — M79674 Pain in right toe(s): Secondary | ICD-10-CM | POA: Diagnosis not present

## 2020-09-03 DIAGNOSIS — Q828 Other specified congenital malformations of skin: Secondary | ICD-10-CM

## 2020-09-03 DIAGNOSIS — M79675 Pain in left toe(s): Secondary | ICD-10-CM

## 2020-09-03 DIAGNOSIS — B351 Tinea unguium: Secondary | ICD-10-CM | POA: Diagnosis not present

## 2020-09-05 NOTE — Progress Notes (Signed)
Subjective: [name] presents today preventative diabetic foot care and painful mycotic nails b/l that are difficult to trim. Pain interferes with ambulation. Aggravating factors include wearing enclosed shoe gear. Pain is relieved with periodic professional debridement.   She relates no new pedal concerns on today's visit. She is inquiring about the status of her diabetic shoes.  Leeroy Cha, MD is patient's PCP. Last visit was: 12/19/2019.  Past Medical History:  Diagnosis Date  . Diabetes mellitus without complication ([place])   . Hypertension      Current Outpatient Medications on File Prior to Visit  Medication Sig Dispense Refill  . augmented betamethasone dipropionate (DIPROLENE-AF) 0.05 % cream     . Blood Glucose Monitoring Suppl (ONE TOUCH ULTRA 2) w/Device KIT U TO TEST BLOOD SUGAR UTD    . cholecalciferol (VITAMIN D) 1000 units tablet Take 1,000 Units by mouth daily.    Marland Kitchen doxycycline (VIBRAMYCIN) 100 MG capsule TK 1 C PO BID WF    . empagliflozin (JARDIANCE) 25 MG TABS tablet Take 25 mg by mouth daily.    . Lancets (ONETOUCH DELICA PLUS [id number]) MISC USE AS DIRECTED USE FOR CHECKING BLOOD SUGARS ONCE DAILY    . lisinopril-hydrochlorothiazide (PRINZIDE,ZESTORETIC) 10-12.5 MG tablet Take 1 tablet by mouth daily.    . metFORMIN (GLUCOPHAGE-XR) 500 MG 24 hr tablet Take 500 mg by mouth daily with breakfast. Take 4 tablets at one time during my evening meal; this equals 2000 mg every day    . Multiple Vitamin (MULTIVITAMIN) tablet Take 1 tablet by mouth daily.    . ONE TOUCH ULTRA TEST test strip     . predniSONE (STERAPRED UNI-PAK 21 TAB) 10 MG (21) TBPK tablet TAKE AS DIRECTED FOR 6 DAYS    . simvastatin (ZOCOR) 10 MG tablet Take 10 mg by mouth daily.    . valACYclovir (VALTREX) 1000 MG tablet TK 1 T PO TID FOR 1 WK UTD    . vitamin B-12 (CYANOCOBALAMIN) 100 MCG tablet Take 100 mcg by mouth daily.     No current facility-administered medications on file prior to  visit.     No Known Allergies  Objective: [name] is a pleasant 69 y.o. y.o. Patient Race: White or Caucasian [1]  female in NAD. AAO x 3.  There were no vitals filed for this visit.  Vascular Examination: Neurovascular status unchanged b/l lower extremities. Capillary refill time to digits immediate b/l. Palpable DP pulses b/l. Palpable PT pulses b/l. Pedal hair absent b/l. Skin temperature gradient within normal limits b/l.  Dermatological Examination: Pedal skin is thin shiny, atrophic b/l lower extremities. No open wounds bilaterally. No interdigital macerations bilaterally. Toenails 1-5 left, R hallux, R 2nd toe, R 3rd toe and R 5th toe elongated, discolored, dystrophic, thickened, and crumbly with subungual debris and tenderness to dorsal palpation. Hyperkeratotic lesion(s) submet head 4 left foot and submet head 5 left foot.  No erythema, no edema, no drainage, no flocculence.   Musculoskeletal: Normal muscle strength 5/5 to all lower extremity muscle groups bilaterally. No pain crepitus or joint limitation noted with ROM b/l. Hammertoes noted to the 2-5 bilaterally. Wearing appropriate fitting shoe gear.  Neurological Examination: Protective sensation intact 5/5 intact bilaterally with 10g monofilament b/l. Vibratory sensation intact b/l. Proprioception intact bilaterally.  Assessment: 1. Pain due to onychomycosis of toenails of both feet   2. Porokeratosis   3. Controlled type 2 diabetes mellitus without complication, without long-term current use of insulin ([place])    Plan: -Examined  patient. -Continue diabetic foot care principles on today's visit. -Ms. Stalnaker's shoes were discontinued. She chose a new shoe on today's visit: [employer]ner x [place] Size 8 1/2. We will call her when her shoes arrive. -Toenails 1-5 left, R hallux, R 2nd toe, R 3rd toe and R 5th toe debrided in length and girth without iatrogenic bleeding with sterile nail nipper and dremel.   -Porokeratosis submet heads 4, 5 left foot pared and enucleated with sterile scalpel blade without incident. -Patient to report any pedal injuries to medical professional immediately. -Patient to continue soft, supportive shoe gear daily. -Patient/POA to call should there be question/concern in the interim.  Return in about 3 months (around 12/03/2020) for diabetic nail trim.  Marzetta Board, DPM

## 2020-09-08 DIAGNOSIS — E1169 Type 2 diabetes mellitus with other specified complication: Secondary | ICD-10-CM | POA: Diagnosis not present

## 2020-09-08 DIAGNOSIS — E785 Hyperlipidemia, unspecified: Secondary | ICD-10-CM | POA: Diagnosis not present

## 2020-09-08 DIAGNOSIS — E118 Type 2 diabetes mellitus with unspecified complications: Secondary | ICD-10-CM | POA: Diagnosis not present

## 2020-09-08 DIAGNOSIS — I10 Essential (primary) hypertension: Secondary | ICD-10-CM | POA: Diagnosis not present

## 2020-09-18 ENCOUNTER — Other Ambulatory Visit: Payer: Self-pay

## 2020-09-18 NOTE — Patient Outreach (Signed)
  [facility]) Care Management Chronic Special Needs Program  09/18/2020  Name: [name] DOB: [birth year]  MRN: [id number]  [name] is enrolled in a chronic special needs plan for Diabetes. RNCM called to follow up and update care plan.   Subjective: Client reports she continues to be active with routine excercises. She continues to work some for [employer]. She reports she lives alone, but has family, friend and church support. Per client last A1C 7.4 per client. Client denies any issues or concerns. Health risk assessment completed.  Goals Addressed            This Visit's Progress   . COMPLETED: Client will verbalize knowledge of self management of Hypertension as evidences by BP reading of 140/90 or less; or as defined by provider       Client reports blood pressure usually runs around 120/80. She states she attends provider visits as scheduled, takes medications as prescribed, monitors salt intake.     Marland Kitchen HEMOGLOBIN A1C < 7       A1C 7.5 on 02/21/2020. Keep up the good work!  Continue Diabetes self management actions:  Glucose monitoring per provider recommendations  Eat Healthy-eat low carbohydrate andlow salt meals, watch portion sizes and avoid sugar sweetened drinks.  Visit provider every 3-6 months as directed  Hbg A1C level every 3-6 months.  Take medications as prescribed.  Attend provider visits as scheduled.    . Obtain annual  Lipid Profile, LDL-C   On track    Last documented Done 09/10/2019. Client reports upcoming annual October 2021.    Marland Kitchen COMPLETED: Obtain Annual Eye (retinal)  Exam        Per client done 03/26/2020    . COMPLETED: Obtain Annual Foot Exam       Per client done 09/03/2020    . Obtain annual screen for micro albuminuria (urine) , nephropathy (kidney problems)   On track    Last documented 09/10/2019. Client reports an upcoming appointment for annual October 2021.     Marland Kitchen COMPLETED: Obtain Hemoglobin A1C at least 2  times per year       Client reports A1C completed twice a year last A1C 7/5 on 02/21/2020.    Marland Kitchen Patient Stated: "I want to try to loose more weight and getting my A1C down"       Discussed client goal to increase exercise and loose weight. Referral to [facility] completed. Emmi education: "walking to fitness"; "exercise and aging".    . Visit Primary Care Provider or Endocrinologist at least 2 times per year    On track    Client reports she sees her provider at least 2 times/year and has an annual scheduled for October 2021.      Plan: send updated care plan to client; send updated care plan to primary care provider. Next outreach per tier level.   Thea Silversmith, RN, MSN, [place] [phone]

## 2020-09-23 DIAGNOSIS — I10 Essential (primary) hypertension: Secondary | ICD-10-CM | POA: Diagnosis not present

## 2020-09-23 DIAGNOSIS — E669 Obesity, unspecified: Secondary | ICD-10-CM | POA: Diagnosis not present

## 2020-09-23 DIAGNOSIS — Z7984 Long term (current) use of oral hypoglycemic drugs: Secondary | ICD-10-CM | POA: Diagnosis not present

## 2020-09-23 DIAGNOSIS — Z23 Encounter for immunization: Secondary | ICD-10-CM | POA: Diagnosis not present

## 2020-09-23 DIAGNOSIS — Z7189 Other specified counseling: Secondary | ICD-10-CM | POA: Diagnosis not present

## 2020-09-23 DIAGNOSIS — Z1389 Encounter for screening for other disorder: Secondary | ICD-10-CM | POA: Diagnosis not present

## 2020-09-23 DIAGNOSIS — E119 Type 2 diabetes mellitus without complications: Secondary | ICD-10-CM | POA: Diagnosis not present

## 2020-09-23 DIAGNOSIS — E118 Type 2 diabetes mellitus with unspecified complications: Secondary | ICD-10-CM | POA: Diagnosis not present

## 2020-09-23 DIAGNOSIS — E785 Hyperlipidemia, unspecified: Secondary | ICD-10-CM | POA: Diagnosis not present

## 2020-09-23 DIAGNOSIS — Z0000 Encounter for general adult medical examination without abnormal findings: Secondary | ICD-10-CM | POA: Diagnosis not present

## 2020-10-23 ENCOUNTER — Other Ambulatory Visit: Payer: Self-pay

## 2020-10-23 NOTE — Patient Outreach (Signed)
  [facility]) Care Management Chronic Special Needs Program    10/23/2020  Name: MILYN STAPLETON, DOB: 09-26-1951  MRN: [id number]   [facility] Management will continue to provide services for this member through 12/19/2020. The [employer] Care Management Team will assume care 12/20/2020.   Thea Silversmith, RN, MSN, [place] [phone]

## 2020-11-18 DIAGNOSIS — E118 Type 2 diabetes mellitus with unspecified complications: Secondary | ICD-10-CM | POA: Diagnosis not present

## 2020-11-18 DIAGNOSIS — E1169 Type 2 diabetes mellitus with other specified complication: Secondary | ICD-10-CM | POA: Diagnosis not present

## 2020-11-18 DIAGNOSIS — E785 Hyperlipidemia, unspecified: Secondary | ICD-10-CM | POA: Diagnosis not present

## 2020-11-18 DIAGNOSIS — I10 Essential (primary) hypertension: Secondary | ICD-10-CM | POA: Diagnosis not present

## 2020-12-03 ENCOUNTER — Encounter: Payer: Self-pay | Admitting: Podiatry

## 2020-12-03 ENCOUNTER — Ambulatory Visit: Payer: HMO | Admitting: Podiatry

## 2020-12-03 ENCOUNTER — Other Ambulatory Visit: Payer: Self-pay

## 2020-12-03 DIAGNOSIS — E669 Obesity, unspecified: Secondary | ICD-10-CM | POA: Insufficient documentation

## 2020-12-03 DIAGNOSIS — L84 Corns and callosities: Secondary | ICD-10-CM | POA: Diagnosis not present

## 2020-12-03 DIAGNOSIS — B351 Tinea unguium: Secondary | ICD-10-CM

## 2020-12-03 DIAGNOSIS — M2042 Other hammer toe(s) (acquired), left foot: Secondary | ICD-10-CM | POA: Diagnosis not present

## 2020-12-03 DIAGNOSIS — M2041 Other hammer toe(s) (acquired), right foot: Secondary | ICD-10-CM | POA: Diagnosis not present

## 2020-12-03 DIAGNOSIS — E1169 Type 2 diabetes mellitus with other specified complication: Secondary | ICD-10-CM | POA: Diagnosis not present

## 2020-12-03 DIAGNOSIS — E1140 Type 2 diabetes mellitus with diabetic neuropathy, unspecified: Secondary | ICD-10-CM | POA: Diagnosis not present

## 2020-12-03 DIAGNOSIS — N200 Calculus of kidney: Secondary | ICD-10-CM | POA: Insufficient documentation

## 2020-12-03 DIAGNOSIS — M79674 Pain in right toe(s): Secondary | ICD-10-CM

## 2020-12-03 DIAGNOSIS — I10 Essential (primary) hypertension: Secondary | ICD-10-CM | POA: Insufficient documentation

## 2020-12-03 DIAGNOSIS — R928 Other abnormal and inconclusive findings on diagnostic imaging of breast: Secondary | ICD-10-CM | POA: Insufficient documentation

## 2020-12-03 DIAGNOSIS — C4490 Unspecified malignant neoplasm of skin, unspecified: Secondary | ICD-10-CM | POA: Insufficient documentation

## 2020-12-03 DIAGNOSIS — M79675 Pain in left toe(s): Secondary | ICD-10-CM | POA: Diagnosis not present

## 2020-12-03 DIAGNOSIS — R209 Unspecified disturbances of skin sensation: Secondary | ICD-10-CM | POA: Insufficient documentation

## 2020-12-03 DIAGNOSIS — E785 Hyperlipidemia, unspecified: Secondary | ICD-10-CM | POA: Insufficient documentation

## 2020-12-03 DIAGNOSIS — C439 Malignant melanoma of skin, unspecified: Secondary | ICD-10-CM | POA: Insufficient documentation

## 2020-12-03 NOTE — Progress Notes (Signed)
Subjective: [name] presents today preventative diabetic foot care and painful mycotic nails b/l that are difficult to trim. Pain interferes with ambulation. Aggravating factors include wearing enclosed shoe gear. Pain is relieved with periodic professional debridement.   She relates no new pedal concerns on today's visit. She is also picking up her diabetic shoes today.  Leeroy Cha, MD is patient's PCP. Last visit was: 11/21/2020.  Past Medical History:  Diagnosis Date  . Diabetes mellitus without complication ([place])   . Hypertension      Current Outpatient Medications on File Prior to Visit  Medication Sig Dispense Refill  . Lancet Devices (ONE TOUCH DELICA LANCING DEV) MISC See admin instructions.    Marland Kitchen augmented betamethasone dipropionate (DIPROLENE-AF) 0.05 % cream     . Blood Glucose Monitoring Suppl (ONE TOUCH ULTRA 2) w/Device KIT U TO TEST BLOOD SUGAR UTD    . cholecalciferol (VITAMIN D) 1000 units tablet Take 1,000 Units by mouth daily.    Marland Kitchen doxycycline (VIBRAMYCIN) 100 MG capsule TK 1 C PO BID WF    . empagliflozin (JARDIANCE) 25 MG TABS tablet Take 25 mg by mouth daily.    . Lancets (ONETOUCH DELICA PLUS [id number]) MISC USE AS DIRECTED USE FOR CHECKING BLOOD SUGARS ONCE DAILY    . lisinopril-hydrochlorothiazide (PRINZIDE,ZESTORETIC) 10-12.5 MG tablet Take 1 tablet by mouth daily.    . metFORMIN (GLUCOPHAGE-XR) 500 MG 24 hr tablet Take 500 mg by mouth daily with breakfast. Take 4 tablets at one time during my evening meal; this equals 2000 mg every day    . Multiple Vitamin (MULTIVITAMIN) tablet Take 1 tablet by mouth daily.    . ONE TOUCH ULTRA TEST test strip     . predniSONE (STERAPRED UNI-PAK 21 TAB) 10 MG (21) TBPK tablet TAKE AS DIRECTED FOR 6 DAYS    . simvastatin (ZOCOR) 10 MG tablet Take 10 mg by mouth daily.    . valACYclovir (VALTREX) 1000 MG tablet TK 1 T PO TID FOR 1 WK UTD    . vitamin B-12 (CYANOCOBALAMIN) 100 MCG tablet Take 100 mcg by mouth  daily.     No current facility-administered medications on file prior to visit.     No Known Allergies  Objective: [name] is a pleasant 69 y.o. y.o. Patient Race: White or Caucasian [1]  female in NAD. AAO x 3.  There were no vitals filed for this visit.  Vascular Examination: Neurovascular status unchanged b/l lower extremities. Capillary refill time to digits immediate b/l. Palpable DP pulses b/l. Palpable PT pulses b/l. Pedal hair absent b/l. Skin temperature gradient within normal limits b/l.  Dermatological Examination: Pedal skin is thin shiny, atrophic b/l lower extremities. No open wounds bilaterally. No interdigital macerations bilaterally. Toenails 1-5 left, R hallux, R 2nd toe, R 3rd toe and R 5th toe elongated, discolored, dystrophic, thickened, and crumbly with subungual debris and tenderness to dorsal palpation. Hyperkeratotic lesion(s) submet head 5 left foot.  No erythema, no edema, no drainage, no fluctuance.   Musculoskeletal: Normal muscle strength 5/5 to all lower extremity muscle groups bilaterally. No pain crepitus or joint limitation noted with ROM b/l. Hammertoes noted to the 2-5 bilaterally. Wearing appropriate fitting shoe gear.  Neurological Examination: Protective sensation intact 5/5 intact bilaterally with 10g monofilament b/l. Vibratory sensation intact b/l. Proprioception intact bilaterally.  Assessment: 1. Pain due to onychomycosis of toenails of both feet   2. Callus   3. Acquired hammertoes of both feet   4. Type 2 diabetes  mellitus with other specified complication, without long-term current use of insulin ([place])    Plan: -Examined patient. -Continue diabetic foot care principles on today's visit. -Dispensed one pair diabetic shoes and 3 pair total contact insoles. Shoes were appropriate fit with no heel slippage. Reviewed warranty information and patient signed all paperwork stating patient received shoes, insert(s)/filler(s), break-in  instructions and warranty information. Patient instructed not to wear shoes outside unless completely satisfied. Patient related understanding. -Toenails 1-5 left, R hallux, R 2nd toe, R 3rd toe and R 5th toe debrided in length and girth without iatrogenic bleeding with sterile nail nipper and dremel.  -Porokeratosis submet heads 5 left foot pared and enucleated with sterile scalpel blade without incident. -Patient to report any pedal injuries to medical professional immediately. -Patient to continue soft, supportive shoe gear daily. -Patient/POA to call should there be question/concern in the interim.  Return in about 3 months (around 03/03/2021) for diabetic toenails, corn(s)/callus(es).  Marzetta Board, DPM

## 2020-12-18 DIAGNOSIS — E118 Type 2 diabetes mellitus with unspecified complications: Secondary | ICD-10-CM | POA: Diagnosis not present

## 2020-12-18 DIAGNOSIS — E785 Hyperlipidemia, unspecified: Secondary | ICD-10-CM | POA: Diagnosis not present

## 2020-12-18 DIAGNOSIS — I10 Essential (primary) hypertension: Secondary | ICD-10-CM | POA: Diagnosis not present

## 2020-12-18 DIAGNOSIS — E1169 Type 2 diabetes mellitus with other specified complication: Secondary | ICD-10-CM | POA: Diagnosis not present

## 2020-12-23 ENCOUNTER — Other Ambulatory Visit: Payer: Self-pay

## 2021-01-14 DIAGNOSIS — Z85828 Personal history of other malignant neoplasm of skin: Secondary | ICD-10-CM | POA: Diagnosis not present

## 2021-01-14 DIAGNOSIS — L821 Other seborrheic keratosis: Secondary | ICD-10-CM | POA: Diagnosis not present

## 2021-01-14 DIAGNOSIS — C44619 Basal cell carcinoma of skin of left upper limb, including shoulder: Secondary | ICD-10-CM | POA: Diagnosis not present

## 2021-01-14 DIAGNOSIS — L570 Actinic keratosis: Secondary | ICD-10-CM | POA: Diagnosis not present

## 2021-01-14 DIAGNOSIS — L905 Scar conditions and fibrosis of skin: Secondary | ICD-10-CM | POA: Diagnosis not present

## 2021-01-14 DIAGNOSIS — D2271 Melanocytic nevi of right lower limb, including hip: Secondary | ICD-10-CM | POA: Diagnosis not present

## 2021-01-14 DIAGNOSIS — D0462 Carcinoma in situ of skin of left upper limb, including shoulder: Secondary | ICD-10-CM | POA: Diagnosis not present

## 2021-01-14 DIAGNOSIS — D485 Neoplasm of uncertain behavior of skin: Secondary | ICD-10-CM | POA: Diagnosis not present

## 2021-01-14 DIAGNOSIS — L918 Other hypertrophic disorders of the skin: Secondary | ICD-10-CM | POA: Diagnosis not present

## 2021-01-14 DIAGNOSIS — Z85820 Personal history of malignant melanoma of skin: Secondary | ICD-10-CM | POA: Diagnosis not present

## 2021-01-14 DIAGNOSIS — H61002 Unspecified perichondritis of left external ear: Secondary | ICD-10-CM | POA: Diagnosis not present

## 2021-01-19 DIAGNOSIS — E1169 Type 2 diabetes mellitus with other specified complication: Secondary | ICD-10-CM | POA: Diagnosis not present

## 2021-01-19 DIAGNOSIS — E785 Hyperlipidemia, unspecified: Secondary | ICD-10-CM | POA: Diagnosis not present

## 2021-01-19 DIAGNOSIS — E118 Type 2 diabetes mellitus with unspecified complications: Secondary | ICD-10-CM | POA: Diagnosis not present

## 2021-01-19 DIAGNOSIS — I10 Essential (primary) hypertension: Secondary | ICD-10-CM | POA: Diagnosis not present

## 2021-02-02 DIAGNOSIS — I10 Essential (primary) hypertension: Secondary | ICD-10-CM | POA: Diagnosis not present

## 2021-02-02 DIAGNOSIS — E1169 Type 2 diabetes mellitus with other specified complication: Secondary | ICD-10-CM | POA: Diagnosis not present

## 2021-02-02 DIAGNOSIS — E785 Hyperlipidemia, unspecified: Secondary | ICD-10-CM | POA: Diagnosis not present

## 2021-02-02 DIAGNOSIS — E118 Type 2 diabetes mellitus with unspecified complications: Secondary | ICD-10-CM | POA: Diagnosis not present

## 2021-02-05 DIAGNOSIS — C44619 Basal cell carcinoma of skin of left upper limb, including shoulder: Secondary | ICD-10-CM | POA: Diagnosis not present

## 2021-02-05 DIAGNOSIS — D0462 Carcinoma in situ of skin of left upper limb, including shoulder: Secondary | ICD-10-CM | POA: Diagnosis not present

## 2021-03-04 ENCOUNTER — Ambulatory Visit: Payer: HMO | Admitting: Podiatry

## 2021-03-04 ENCOUNTER — Other Ambulatory Visit: Payer: Self-pay

## 2021-03-04 ENCOUNTER — Encounter: Payer: Self-pay | Admitting: Podiatry

## 2021-03-04 DIAGNOSIS — M2042 Other hammer toe(s) (acquired), left foot: Secondary | ICD-10-CM

## 2021-03-04 DIAGNOSIS — Q828 Other specified congenital malformations of skin: Secondary | ICD-10-CM

## 2021-03-04 DIAGNOSIS — L84 Corns and callosities: Secondary | ICD-10-CM

## 2021-03-04 DIAGNOSIS — M79675 Pain in left toe(s): Secondary | ICD-10-CM

## 2021-03-04 DIAGNOSIS — B351 Tinea unguium: Secondary | ICD-10-CM

## 2021-03-04 DIAGNOSIS — M79674 Pain in right toe(s): Secondary | ICD-10-CM

## 2021-03-04 DIAGNOSIS — E1169 Type 2 diabetes mellitus with other specified complication: Secondary | ICD-10-CM | POA: Diagnosis not present

## 2021-03-04 DIAGNOSIS — M2041 Other hammer toe(s) (acquired), right foot: Secondary | ICD-10-CM

## 2021-03-04 NOTE — Progress Notes (Signed)
Subjective: [name] presents today preventative diabetic foot care and painful mycotic nails b/l that are difficult to trim. Pain interferes with ambulation. Aggravating factors include wearing enclosed shoe gear. Pain is relieved with periodic professional debridement.   She states she is starting to have more neuropathy symptoms in her legs/feet. She has had diabetes for 14 years. She doesn't feel it in her hands.   She did speak to [facility] in our office about new diabetic shoes.  Leeroy Cha, MD is patient's PCP. Last visit was: 02/23/2021.  No Known Allergies  Objective: [name] is a pleasant 70 y.o. Caucasian female in NAD. AAO x 3.  There were no vitals filed for this visit.  Vascular Examination: Neurovascular status unchanged b/l lower extremities. Capillary refill time to digits immediate b/l. Palpable DP pulses b/l. Palpable PT pulses b/l. Pedal hair absent b/l. Skin temperature gradient within normal limits b/l.  Dermatological Examination: Pedal skin is thin shiny, atrophic b/l lower extremities. No open wounds bilaterally. No interdigital macerations bilaterally. Toenails 1-5 b/l elongated, discolored, dystrophic, thickened, crumbly with subungual debris and tenderness to dorsal palpation. Hyperkeratotic lesion(s) submet head 5 left foot.  No erythema, no edema, no drainage, no fluctuance.   Musculoskeletal: Normal muscle strength 5/5 to all lower extremity muscle groups bilaterally. No pain crepitus or joint limitation noted with ROM b/l. Hammertoes noted to the 2-5 bilaterally. Wearing appropriate fitting shoe gear.  Neurological Examination: Protective sensation intact 5/5 intact bilaterally with 10g monofilament b/l. Vibratory sensation intact b/l. Proprioception intact bilaterally.  Assessment: 1. Pain due to onychomycosis of toenails of both feet   2. Callus   3. Acquired hammertoes of both feet   4. Type 2 diabetes mellitus with other  specified complication, without long-term current use of insulin ([place])    Plan: -Examined patient. -Her insurance, [employer] requires preauthorization for diabetic shoes. Dawn will call her when we receive the approval.  -We discussed her neuropathy symptoms. I recommended Neurology consultation, but she would like to discuss her symptoms with Dr. Fara Olden on her March 24, 2021, visit. -Continue diabetic foot care principles on today's visit. -Toenails 1-5 b/l debrided in length and girth without iatrogenic bleeding with sterile nail nipper and dremel.  -Callus submet head 5 left foot pared and enucleated with sterile scalpel blade without incident. -Patient to report any pedal injuries to medical professional immediately. -Patient to continue soft, supportive shoe gear daily. -Patient/POA to call should there be question/concern in the interim.  Return in about 3 months (around 06/04/2021).  Marzetta Board, DPM

## 2021-03-18 DIAGNOSIS — R252 Cramp and spasm: Secondary | ICD-10-CM | POA: Diagnosis not present

## 2021-03-18 DIAGNOSIS — I83893 Varicose veins of bilateral lower extremities with other complications: Secondary | ICD-10-CM | POA: Diagnosis not present

## 2021-03-18 DIAGNOSIS — Z7984 Long term (current) use of oral hypoglycemic drugs: Secondary | ICD-10-CM | POA: Diagnosis not present

## 2021-03-18 DIAGNOSIS — H538 Other visual disturbances: Secondary | ICD-10-CM | POA: Diagnosis not present

## 2021-03-18 DIAGNOSIS — E118 Type 2 diabetes mellitus with unspecified complications: Secondary | ICD-10-CM | POA: Diagnosis not present

## 2021-03-19 DIAGNOSIS — E785 Hyperlipidemia, unspecified: Secondary | ICD-10-CM | POA: Diagnosis not present

## 2021-03-19 DIAGNOSIS — E1169 Type 2 diabetes mellitus with other specified complication: Secondary | ICD-10-CM | POA: Diagnosis not present

## 2021-03-19 DIAGNOSIS — E118 Type 2 diabetes mellitus with unspecified complications: Secondary | ICD-10-CM | POA: Diagnosis not present

## 2021-03-19 DIAGNOSIS — I10 Essential (primary) hypertension: Secondary | ICD-10-CM | POA: Diagnosis not present

## 2021-03-24 DIAGNOSIS — E119 Type 2 diabetes mellitus without complications: Secondary | ICD-10-CM | POA: Diagnosis not present

## 2021-03-24 DIAGNOSIS — H25813 Combined forms of age-related cataract, bilateral: Secondary | ICD-10-CM | POA: Diagnosis not present

## 2021-04-15 DIAGNOSIS — I10 Essential (primary) hypertension: Secondary | ICD-10-CM | POA: Diagnosis not present

## 2021-04-15 DIAGNOSIS — E118 Type 2 diabetes mellitus with unspecified complications: Secondary | ICD-10-CM | POA: Diagnosis not present

## 2021-04-15 DIAGNOSIS — E785 Hyperlipidemia, unspecified: Secondary | ICD-10-CM | POA: Diagnosis not present

## 2021-04-15 DIAGNOSIS — E1169 Type 2 diabetes mellitus with other specified complication: Secondary | ICD-10-CM | POA: Diagnosis not present

## 2021-04-30 DIAGNOSIS — E118 Type 2 diabetes mellitus with unspecified complications: Secondary | ICD-10-CM | POA: Diagnosis not present

## 2021-04-30 DIAGNOSIS — I10 Essential (primary) hypertension: Secondary | ICD-10-CM | POA: Diagnosis not present

## 2021-04-30 DIAGNOSIS — E785 Hyperlipidemia, unspecified: Secondary | ICD-10-CM | POA: Diagnosis not present

## 2021-04-30 DIAGNOSIS — E1169 Type 2 diabetes mellitus with other specified complication: Secondary | ICD-10-CM | POA: Diagnosis not present

## 2021-05-21 DIAGNOSIS — E785 Hyperlipidemia, unspecified: Secondary | ICD-10-CM | POA: Diagnosis not present

## 2021-05-21 DIAGNOSIS — I10 Essential (primary) hypertension: Secondary | ICD-10-CM | POA: Diagnosis not present

## 2021-05-21 DIAGNOSIS — E1169 Type 2 diabetes mellitus with other specified complication: Secondary | ICD-10-CM | POA: Diagnosis not present

## 2021-05-21 DIAGNOSIS — E118 Type 2 diabetes mellitus with unspecified complications: Secondary | ICD-10-CM | POA: Diagnosis not present

## 2021-06-05 DIAGNOSIS — H25811 Combined forms of age-related cataract, right eye: Secondary | ICD-10-CM | POA: Diagnosis not present

## 2021-06-17 ENCOUNTER — Other Ambulatory Visit: Payer: Self-pay

## 2021-06-17 ENCOUNTER — Ambulatory Visit: Payer: HMO | Admitting: Podiatry

## 2021-06-17 ENCOUNTER — Encounter: Payer: Self-pay | Admitting: Podiatry

## 2021-06-17 ENCOUNTER — Ambulatory Visit (INDEPENDENT_AMBULATORY_CARE_PROVIDER_SITE_OTHER): Payer: HMO | Admitting: Podiatry

## 2021-06-17 DIAGNOSIS — B351 Tinea unguium: Secondary | ICD-10-CM | POA: Diagnosis not present

## 2021-06-17 DIAGNOSIS — M79675 Pain in left toe(s): Secondary | ICD-10-CM

## 2021-06-17 DIAGNOSIS — I83893 Varicose veins of bilateral lower extremities with other complications: Secondary | ICD-10-CM | POA: Insufficient documentation

## 2021-06-17 DIAGNOSIS — M2041 Other hammer toe(s) (acquired), right foot: Secondary | ICD-10-CM

## 2021-06-17 DIAGNOSIS — L84 Corns and callosities: Secondary | ICD-10-CM

## 2021-06-17 DIAGNOSIS — M79674 Pain in right toe(s): Secondary | ICD-10-CM | POA: Diagnosis not present

## 2021-06-17 DIAGNOSIS — E1142 Type 2 diabetes mellitus with diabetic polyneuropathy: Secondary | ICD-10-CM

## 2021-06-17 DIAGNOSIS — E1169 Type 2 diabetes mellitus with other specified complication: Secondary | ICD-10-CM

## 2021-06-17 NOTE — Progress Notes (Signed)
Subjective: [name] is a pleasant 70 y.o. female patient seen today for diabetic foot care with callus of left foot and painful thick toenails that are difficult to trim. Pain interferes with ambulation. Aggravating factors include wearing enclosed shoe gear. Pain is relieved with periodic professional debridement.  She is inquiring about obtaining new diabetic shoes for this year.  Her blood glucose was 120 mg/dl yesterday. She has not checked it this morning.  She voices no new pedal problems on today's visit.  PCP is Leeroy Cha, MD. Last visit was: 03/20/2021.  No Known Allergies  Objective: Physical Exam  General: [name] is a pleasant 70 y.o. Caucasian female, WD, WN in NAD. AAO x 3.   Vascular:  Capillary refill time to digits immediate b/l. Palpable pedal pulses b/l LE. Pedal hair absent. Lower extremity skin temperature gradient within normal limits. No edema noted b/l lower extremities.  Dermatological:  Toenails 1-5 b/l elongated, discolored, dystrophic, thickened, crumbly with subungual debris and tenderness to dorsal palpation. No hyperkeratotic nor porokeratotic lesions present on today's visit. Callus plantar aspect left foot has resolved.  Musculoskeletal:  Normal muscle strength 5/5 to all lower extremity muscle groups bilaterally. No pain crepitus or joint limitation noted with ROM b/l. Hammertoe(s) noted to the 2-5 bilaterally.  Neurological:  Pt has subjective symptoms of neuropathy. Protective sensation intact 5/5 intact bilaterally with 10g monofilament b/l. Vibratory sensation intact b/l.  Assessment and Plan:  1. Pain due to onychomycosis of toenails of both feet   2. Diabetic peripheral neuropathy associated with type 2 diabetes mellitus ([place])     -Examined patient. -Patient will be scheduled for new foot measurements for her diabetic shoes. -Continue diabetic foot care principles. -Continue diabetic shoes daily. -Toenails 1-5  b/l were debrided in length and girth with sterile nail nippers and dremel without iatrogenic bleeding.  -Patient to report any pedal injuries to medical professional immediately. -Patient/POA to call should there be question/concern in the interim.  Return in about 3 months (around 09/17/2021).  Marzetta Board, DPM

## 2021-06-17 NOTE — Progress Notes (Signed)
Patient presented for foam casting for 3 pair custom diabetic shoe inserts.  Patient is to be a size 8 1/2 wide  Diabetic shoes are chosen from [employer]. The shoes chosen are first choice is A 7100 and the second choice is A 7000  The patient will be contacted when the shoes and inserts are ready to be picked up

## 2021-06-19 DIAGNOSIS — H25812 Combined forms of age-related cataract, left eye: Secondary | ICD-10-CM | POA: Diagnosis not present

## 2021-07-05 DIAGNOSIS — E785 Hyperlipidemia, unspecified: Secondary | ICD-10-CM | POA: Diagnosis not present

## 2021-07-05 DIAGNOSIS — I10 Essential (primary) hypertension: Secondary | ICD-10-CM | POA: Diagnosis not present

## 2021-07-05 DIAGNOSIS — E1169 Type 2 diabetes mellitus with other specified complication: Secondary | ICD-10-CM | POA: Diagnosis not present

## 2021-07-05 DIAGNOSIS — E118 Type 2 diabetes mellitus with unspecified complications: Secondary | ICD-10-CM | POA: Diagnosis not present

## 2021-07-16 DIAGNOSIS — L814 Other melanin hyperpigmentation: Secondary | ICD-10-CM | POA: Diagnosis not present

## 2021-07-16 DIAGNOSIS — Z85828 Personal history of other malignant neoplasm of skin: Secondary | ICD-10-CM | POA: Diagnosis not present

## 2021-07-16 DIAGNOSIS — Z85820 Personal history of malignant melanoma of skin: Secondary | ICD-10-CM | POA: Diagnosis not present

## 2021-07-16 DIAGNOSIS — L821 Other seborrheic keratosis: Secondary | ICD-10-CM | POA: Diagnosis not present

## 2021-07-16 DIAGNOSIS — C44319 Basal cell carcinoma of skin of other parts of face: Secondary | ICD-10-CM | POA: Diagnosis not present

## 2021-07-16 DIAGNOSIS — L918 Other hypertrophic disorders of the skin: Secondary | ICD-10-CM | POA: Diagnosis not present

## 2021-07-16 DIAGNOSIS — L570 Actinic keratosis: Secondary | ICD-10-CM | POA: Diagnosis not present

## 2021-07-16 DIAGNOSIS — D1801 Hemangioma of skin and subcutaneous tissue: Secondary | ICD-10-CM | POA: Diagnosis not present

## 2021-07-16 DIAGNOSIS — D485 Neoplasm of uncertain behavior of skin: Secondary | ICD-10-CM | POA: Diagnosis not present

## 2021-07-28 ENCOUNTER — Telehealth: Payer: Self-pay | Admitting: Podiatry

## 2021-07-28 NOTE — Telephone Encounter (Signed)
Received HTA auth # [id number] for diabetic shoes/inserts([id number] [phone] [phone]) valid 8.4.2022 thru 11.2.2022.Marland Kitchen

## 2021-08-12 DIAGNOSIS — Z85820 Personal history of malignant melanoma of skin: Secondary | ICD-10-CM | POA: Diagnosis not present

## 2021-08-12 DIAGNOSIS — C44319 Basal cell carcinoma of skin of other parts of face: Secondary | ICD-10-CM | POA: Diagnosis not present

## 2021-08-12 DIAGNOSIS — Z85828 Personal history of other malignant neoplasm of skin: Secondary | ICD-10-CM | POA: Diagnosis not present

## 2021-08-18 DIAGNOSIS — E785 Hyperlipidemia, unspecified: Secondary | ICD-10-CM | POA: Diagnosis not present

## 2021-08-18 DIAGNOSIS — E118 Type 2 diabetes mellitus with unspecified complications: Secondary | ICD-10-CM | POA: Diagnosis not present

## 2021-08-18 DIAGNOSIS — I10 Essential (primary) hypertension: Secondary | ICD-10-CM | POA: Diagnosis not present

## 2021-08-18 DIAGNOSIS — E1169 Type 2 diabetes mellitus with other specified complication: Secondary | ICD-10-CM | POA: Diagnosis not present

## 2021-08-20 ENCOUNTER — Telehealth: Payer: Self-pay | Admitting: Podiatry

## 2021-08-20 NOTE — Telephone Encounter (Signed)
Diabetic shoes/inserts in..lvm for pt to call to schedule an appt to pick them up..next available is 9.14

## 2021-09-02 ENCOUNTER — Ambulatory Visit (INDEPENDENT_AMBULATORY_CARE_PROVIDER_SITE_OTHER): Payer: HMO | Admitting: *Deleted

## 2021-09-02 ENCOUNTER — Other Ambulatory Visit: Payer: Self-pay

## 2021-09-02 DIAGNOSIS — M2042 Other hammer toe(s) (acquired), left foot: Secondary | ICD-10-CM | POA: Diagnosis not present

## 2021-09-02 DIAGNOSIS — L84 Corns and callosities: Secondary | ICD-10-CM

## 2021-09-02 DIAGNOSIS — E1169 Type 2 diabetes mellitus with other specified complication: Secondary | ICD-10-CM

## 2021-09-02 DIAGNOSIS — M2041 Other hammer toe(s) (acquired), right foot: Secondary | ICD-10-CM

## 2021-09-02 NOTE — Progress Notes (Signed)
Patient presents today to pick up diabetic shoes and insoles.  Patient was dispensed 1 pair of diabetic shoes and 3 pairs of foam casted diabetic insoles. Fit was satisfactory. Instructions for break-in and wear was reviewed and a copy was given to the patient.   Re-appointment for regularly scheduled diabetic foot care visits or if they should experience any trouble with the shoes or insoles.  

## 2021-09-22 ENCOUNTER — Ambulatory Visit (INDEPENDENT_AMBULATORY_CARE_PROVIDER_SITE_OTHER): Payer: HMO | Admitting: Podiatry

## 2021-09-22 ENCOUNTER — Other Ambulatory Visit: Payer: Self-pay

## 2021-09-22 ENCOUNTER — Encounter: Payer: Self-pay | Admitting: Podiatry

## 2021-09-22 DIAGNOSIS — E1142 Type 2 diabetes mellitus with diabetic polyneuropathy: Secondary | ICD-10-CM | POA: Diagnosis not present

## 2021-09-22 DIAGNOSIS — M79675 Pain in left toe(s): Secondary | ICD-10-CM

## 2021-09-22 DIAGNOSIS — M79674 Pain in right toe(s): Secondary | ICD-10-CM

## 2021-09-22 DIAGNOSIS — B351 Tinea unguium: Secondary | ICD-10-CM

## 2021-09-22 DIAGNOSIS — Q828 Other specified congenital malformations of skin: Secondary | ICD-10-CM | POA: Diagnosis not present

## 2021-09-24 DIAGNOSIS — Z0000 Encounter for general adult medical examination without abnormal findings: Secondary | ICD-10-CM | POA: Diagnosis not present

## 2021-09-24 DIAGNOSIS — Z7984 Long term (current) use of oral hypoglycemic drugs: Secondary | ICD-10-CM | POA: Diagnosis not present

## 2021-09-24 DIAGNOSIS — E118 Type 2 diabetes mellitus with unspecified complications: Secondary | ICD-10-CM | POA: Diagnosis not present

## 2021-09-24 DIAGNOSIS — Z23 Encounter for immunization: Secondary | ICD-10-CM | POA: Diagnosis not present

## 2021-09-24 DIAGNOSIS — Z1389 Encounter for screening for other disorder: Secondary | ICD-10-CM | POA: Diagnosis not present

## 2021-09-24 DIAGNOSIS — E785 Hyperlipidemia, unspecified: Secondary | ICD-10-CM | POA: Diagnosis not present

## 2021-09-24 DIAGNOSIS — E1169 Type 2 diabetes mellitus with other specified complication: Secondary | ICD-10-CM | POA: Diagnosis not present

## 2021-09-24 DIAGNOSIS — I10 Essential (primary) hypertension: Secondary | ICD-10-CM | POA: Diagnosis not present

## 2021-09-27 NOTE — Progress Notes (Signed)
  Subjective:  Patient ID: [name], female    DOB: [birth year],  MRN: [id number]  [name] presents to clinic today for at risk foot care with history of diabetic neuropathy and callus(es) left foot and painful thick toenails that are difficult to trim. Painful toenails interfere with ambulation. Aggravating factors include wearing enclosed shoe gear. Pain is relieved with periodic professional debridement. Painful calluses are aggravated when weightbearing with and without shoegear. Pain is relieved with periodic professional debridement.  Patient states blood glucose was 106 mg/dl on yesterday.  Patient did not check blood glucose today.  She is just getting off work from [employer].  She has received her new diabetic shoes and are pleased with them. Patient voices no new pedal problems on today's visit.  PCP is Leeroy Cha, MD , and last visit was 12/19/2019. She has an appointment on tomorrow.  No Known Allergies  Review of Systems: Negative except as noted in the HPI. Objective:   Constitutional [name] is a pleasant 70 y.o. Caucasian female, WD, WN in NAD. AAO x 3.   Vascular Capillary refill time to digits immediate b/l. Palpable DP pulse(s) b/l lower extremities Palpable PT pulse(s) b/l lower extremities Pedal hair absent. Lower extremity skin temperature gradient within normal limits. No pain with calf compression b/l. No edema noted b/l lower extremities. No cyanosis or clubbing noted.  Neurologic Normal speech. Oriented to person, place, and time. Pt has subjective symptoms of neuropathy. Protective sensation intact 5/5 intact bilaterally with 10g monofilament b/l. Vibratory sensation intact b/l.  Dermatologic Skin warm and supple b/l lower extremities. No open wounds b/l lower extremities. No interdigital macerations b/l lower extremities. Toenails 1-5 b/l elongated, discolored, dystrophic, thickened, crumbly with subungual debris and  tenderness to dorsal palpation. Porokeratotic lesion(s) 5th met base left foot. No erythema, no edema, no drainage, no fluctuance.  Orthopedic: Normal muscle strength 5/5 to all lower extremity muscle groups bilaterally. Hammertoe(s) noted to the 2-5 bilaterally. Patient ambulates independent of any assistive aids.   Radiographs: None Assessment:   1. Pain due to onychomycosis of toenails of both feet   2. Porokeratosis   3. Diabetic peripheral neuropathy associated with type 2 diabetes mellitus ([place])    Plan:  Patient was evaluated and treated and all questions answered. Consent given for treatment as described below: -Examined patient. -Continue diabetic foot care principles: inspect feet daily, monitor glucose as recommended by PCP and/or Endocrinologist, and follow prescribed diet per PCP, Endocrinologist and/or dietician. -Continue diabetic shoes daily. -Toenails 1-5 b/l were debrided in length and girth with sterile nail nippers and dremel without iatrogenic bleeding.  -Painful porokeratotic lesion(s) 5th met base left foot pared and enucleated with sterile scalpel blade without incident. Total number of lesions debrided=1. -Patient to report any pedal injuries to medical professional immediately. -Patient/POA to call should there be question/concern in the interim.  Return in about 3 months (around 12/23/2021).  Marzetta Board, DPM

## 2021-11-17 DIAGNOSIS — E785 Hyperlipidemia, unspecified: Secondary | ICD-10-CM | POA: Diagnosis not present

## 2021-11-17 DIAGNOSIS — E1169 Type 2 diabetes mellitus with other specified complication: Secondary | ICD-10-CM | POA: Diagnosis not present

## 2021-11-17 DIAGNOSIS — E118 Type 2 diabetes mellitus with unspecified complications: Secondary | ICD-10-CM | POA: Diagnosis not present

## 2021-11-17 DIAGNOSIS — I10 Essential (primary) hypertension: Secondary | ICD-10-CM | POA: Diagnosis not present

## 2021-11-18 DIAGNOSIS — U071 COVID-19: Secondary | ICD-10-CM | POA: Diagnosis not present

## 2021-11-18 DIAGNOSIS — E669 Obesity, unspecified: Secondary | ICD-10-CM | POA: Diagnosis not present

## 2021-11-18 DIAGNOSIS — E118 Type 2 diabetes mellitus with unspecified complications: Secondary | ICD-10-CM | POA: Diagnosis not present

## 2021-11-18 DIAGNOSIS — Z7984 Long term (current) use of oral hypoglycemic drugs: Secondary | ICD-10-CM | POA: Diagnosis not present

## 2021-12-16 DIAGNOSIS — E785 Hyperlipidemia, unspecified: Secondary | ICD-10-CM | POA: Diagnosis not present

## 2021-12-16 DIAGNOSIS — I10 Essential (primary) hypertension: Secondary | ICD-10-CM | POA: Diagnosis not present

## 2021-12-16 DIAGNOSIS — E118 Type 2 diabetes mellitus with unspecified complications: Secondary | ICD-10-CM | POA: Diagnosis not present

## 2021-12-16 DIAGNOSIS — E1169 Type 2 diabetes mellitus with other specified complication: Secondary | ICD-10-CM | POA: Diagnosis not present

## 2021-12-28 ENCOUNTER — Encounter: Payer: Self-pay | Admitting: Podiatry

## 2021-12-28 ENCOUNTER — Other Ambulatory Visit: Payer: Self-pay

## 2021-12-28 ENCOUNTER — Ambulatory Visit (INDEPENDENT_AMBULATORY_CARE_PROVIDER_SITE_OTHER): Payer: HMO | Admitting: Podiatry

## 2021-12-28 DIAGNOSIS — M2041 Other hammer toe(s) (acquired), right foot: Secondary | ICD-10-CM | POA: Diagnosis not present

## 2021-12-28 DIAGNOSIS — B351 Tinea unguium: Secondary | ICD-10-CM

## 2021-12-28 DIAGNOSIS — E119 Type 2 diabetes mellitus without complications: Secondary | ICD-10-CM | POA: Diagnosis not present

## 2021-12-28 DIAGNOSIS — E1142 Type 2 diabetes mellitus with diabetic polyneuropathy: Secondary | ICD-10-CM

## 2021-12-28 DIAGNOSIS — M79674 Pain in right toe(s): Secondary | ICD-10-CM

## 2021-12-28 DIAGNOSIS — M2042 Other hammer toe(s) (acquired), left foot: Secondary | ICD-10-CM | POA: Diagnosis not present

## 2021-12-28 DIAGNOSIS — Q828 Other specified congenital malformations of skin: Secondary | ICD-10-CM | POA: Diagnosis not present

## 2021-12-28 DIAGNOSIS — M79675 Pain in left toe(s): Secondary | ICD-10-CM

## 2021-12-28 NOTE — Progress Notes (Signed)
ANNUAL DIABETIC FOOT EXAM  Subjective: [name] presents today for for annual diabetic foot examination and painful porokeratotic lesion(s) plantar left foot and painful mycotic toenails that limit ambulation. Painful toenails interfere with ambulation. Aggravating factors include wearing enclosed shoe gear. Pain is relieved with periodic professional debridement. Painful porokeratotic lesions are aggravated when weightbearing with and without shoegear. Pain is relieved with periodic professional debridement..  Patient relates several year h/o diabetes.  Patient denies any h/o foot wounds.  Patient relates any numbness, tingling, burning, or pins/needle sensation in feet. She relates occasional spasms and nocturnal cramps.  Patient's blood sugar was 148 mg/dl today.  Leeroy Cha, MD is patient's PCP. Last visit was 11/20/2021.  Past Medical History:  Diagnosis Date   Diabetes mellitus without complication ([place])    Hypertension    Patient Active Problem List   Diagnosis Date Noted   Varicose veins of bilateral lower extremities with other complications 75/64/3329   Dyslipidemia 12/03/2020   Essential hypertension 12/03/2020   Kidney stone 12/03/2020   Malignant melanoma ([place]) 12/03/2020   Obesity 12/03/2020   Other abnormal and inconclusive findings on diagnostic imaging of breast 12/03/2020   Skin sensation disturbance 12/03/2020   Skin cancer 12/03/2020   Type 2 diabetes mellitus with other specified complication ([place]) 51/88/4166   Past Surgical History:  Procedure Laterality Date   ABDOMINAL HYSTERECTOMY     BACK SURGERY     HERNIA REPAIR     JOINT REPLACEMENT     SKIN CANCER EXCISION     on the face and arms   Current Outpatient Medications on File Prior to Visit  Medication Sig Dispense Refill   augmented betamethasone dipropionate (DIPROLENE-AF) 0.05 % cream      Blood Glucose Monitoring Suppl (ONE TOUCH ULTRA 2) w/Device KIT U TO TEST BLOOD SUGAR  UTD     cholecalciferol (VITAMIN D) 1000 units tablet Take 1,000 Units by mouth daily.     doxycycline (VIBRAMYCIN) 100 MG capsule TK 1 C PO BID WF     empagliflozin (JARDIANCE) 25 MG TABS tablet Take 25 mg by mouth daily.     Lancet Devices (ONE TOUCH DELICA LANCING DEV) MISC See admin instructions.     Lancets (ONETOUCH DELICA PLUS [id number]) MISC USE AS DIRECTED USE FOR CHECKING BLOOD SUGARS ONCE DAILY     lisinopril-hydrochlorothiazide (PRINZIDE,ZESTORETIC) 10-12.5 MG tablet Take 1 tablet by mouth daily.     metFORMIN (GLUCOPHAGE-XR) 500 MG 24 hr tablet Take 500 mg by mouth daily with breakfast. Take 4 tablets at one time during my evening meal; this equals 2000 mg every day     Multiple Vitamin (MULTIVITAMIN) tablet Take 1 tablet by mouth daily.     ofloxacin (OCUFLOX) 0.3 % ophthalmic solution Place 1 drop into the right eye 4 (four) times daily.     ONE TOUCH ULTRA TEST test strip      prednisoLONE acetate (PRED FORTE) 1 % ophthalmic suspension Place 1 drop into the right eye 4 (four) times daily.     predniSONE (STERAPRED UNI-PAK 21 TAB) 10 MG (21) TBPK tablet TAKE AS DIRECTED FOR 6 DAYS     PROLENSA 0.07 % SOLN Place 1 drop into the right eye daily.     Semaglutide,0.25 or 0.5MG/DOS, (OZEMPIC, 0.25 OR 0.5 MG/DOSE,) 2 MG/1.5ML SOPN 0.25 mg x 4 weeks then 0.5 mg     simvastatin (ZOCOR) 10 MG tablet Take 10 mg by mouth daily.     triamcinolone (KENALOG) 0.1 % Apply 1  application topically 2 (two) times daily as needed.     valACYclovir (VALTREX) 1000 MG tablet TK 1 T PO TID FOR 1 WK UTD     vitamin B-12 (CYANOCOBALAMIN) 100 MCG tablet Take 100 mcg by mouth daily.     No current facility-administered medications on file prior to visit.    No Known Allergies Social History   Occupational History   Not on file  Tobacco Use   Smoking status: Never   Smokeless tobacco: Never  Substance and Sexual Activity   Alcohol use: No   Drug use: No   Sexual activity: Not on file   History  reviewed. No pertinent family history. Immunization History  Administered Date(s) Administered   Influenza Split 12/06/2015, 09/18/2016   PFIZER(Purple Top)SARS-COV-2 Vaccination 02/18/2020, 03/10/2020   Pneumococcal Conjugate-13 08/21/2015   Pneumococcal Polysaccharide-23 10/27/2016   Tdap 06/26/2016     Review of Systems: Negative except as noted in the HPI.   Objective: There were no vitals filed for this visit.  [name] is a pleasant 71 y.o. female in NAD. AAO X 3.  Vascular Examination: CFT immediate b/l LE. Palpable DP/PT pulses b/l LE. Digital hair absent b/l. Skin temperature gradient WNL b/l. No pain with calf compression b/l. No edema noted b/l. No cyanosis or clubbing noted b/l LE.  Dermatological Examination: Pedal integument with normal turgor, texture and tone b/l LE. No open wounds b/l. No interdigital macerations b/l. Toenails 1-5 b/l elongated, thickened, discolored with subungual debris. +Tenderness with dorsal palpation of nailplates. Porokeratotic lesion(s) noted submet head 5 left foot.  Musculoskeletal Examination: Normal muscle strength 5/5 to all lower extremity muscle groups bilaterally. Hammertoe deformity noted 2-5 b/l.Marland Kitchen No pain, crepitus or joint limitation noted with ROM b/l LE.  Patient ambulates independently without assistive aids.  Footwear Assessment: Does the patient wear appropriate shoes? Yes. Does the patient need inserts/orthotics? Yes.  Neurological Examination: Pt has subjective symptoms of neuropathy. Protective sensation intact 5/5 intact bilaterally with 10g monofilament b/l. Vibratory sensation intact b/l.  Assessment: 1. Pain due to onychomycosis of toenails of both feet   2. Porokeratosis   3. Acquired hammertoes of both feet   4. Diabetic peripheral neuropathy associated with type 2 diabetes mellitus ([place])   5. Encounter for diabetic foot exam ([place])     ADA Risk Categorization: Low Risk :  Patient has all of the  following: Intact protective sensation No prior foot ulcer  No severe deformity Pedal pulses present  Plan: -Diabetic foot examination performed today. -Continue foot and shoe inspections daily. Monitor blood glucose per PCP/Endocrinologist's recommendations. -Mycotic toenails 1-5 bilaterally were debrided in length and girth with sterile nail nippers and dremel without incident. -Painful porokeratotic lesion(s) submet head 5 left foot pared and enucleated with sterile scalpel blade without incident. Total number of lesions debrided=1. -Patient/POA to call should there be question/concern in the interim.  Return in about 3 months (around 03/28/2022).  Marzetta Board, DPM

## 2022-01-11 DIAGNOSIS — Z961 Presence of intraocular lens: Secondary | ICD-10-CM | POA: Diagnosis not present

## 2022-01-11 DIAGNOSIS — H40013 Open angle with borderline findings, low risk, bilateral: Secondary | ICD-10-CM | POA: Diagnosis not present

## 2022-01-11 DIAGNOSIS — E119 Type 2 diabetes mellitus without complications: Secondary | ICD-10-CM | POA: Diagnosis not present

## 2022-01-18 DIAGNOSIS — L570 Actinic keratosis: Secondary | ICD-10-CM | POA: Diagnosis not present

## 2022-01-18 DIAGNOSIS — D485 Neoplasm of uncertain behavior of skin: Secondary | ICD-10-CM | POA: Diagnosis not present

## 2022-01-18 DIAGNOSIS — D1801 Hemangioma of skin and subcutaneous tissue: Secondary | ICD-10-CM | POA: Diagnosis not present

## 2022-01-18 DIAGNOSIS — L821 Other seborrheic keratosis: Secondary | ICD-10-CM | POA: Diagnosis not present

## 2022-01-18 DIAGNOSIS — D2339 Other benign neoplasm of skin of other parts of face: Secondary | ICD-10-CM | POA: Diagnosis not present

## 2022-01-18 DIAGNOSIS — Z85828 Personal history of other malignant neoplasm of skin: Secondary | ICD-10-CM | POA: Diagnosis not present

## 2022-01-18 DIAGNOSIS — Z85820 Personal history of malignant melanoma of skin: Secondary | ICD-10-CM | POA: Diagnosis not present

## 2022-01-19 DIAGNOSIS — H6123 Impacted cerumen, bilateral: Secondary | ICD-10-CM | POA: Diagnosis not present

## 2022-01-19 DIAGNOSIS — E118 Type 2 diabetes mellitus with unspecified complications: Secondary | ICD-10-CM | POA: Diagnosis not present

## 2022-01-19 DIAGNOSIS — R109 Unspecified abdominal pain: Secondary | ICD-10-CM | POA: Diagnosis not present

## 2022-01-19 DIAGNOSIS — I10 Essential (primary) hypertension: Secondary | ICD-10-CM | POA: Diagnosis not present

## 2022-01-19 DIAGNOSIS — E1169 Type 2 diabetes mellitus with other specified complication: Secondary | ICD-10-CM | POA: Diagnosis not present

## 2022-01-19 DIAGNOSIS — R42 Dizziness and giddiness: Secondary | ICD-10-CM | POA: Diagnosis not present

## 2022-01-19 DIAGNOSIS — N200 Calculus of kidney: Secondary | ICD-10-CM | POA: Diagnosis not present

## 2022-01-19 DIAGNOSIS — E785 Hyperlipidemia, unspecified: Secondary | ICD-10-CM | POA: Diagnosis not present

## 2022-01-20 DIAGNOSIS — H6123 Impacted cerumen, bilateral: Secondary | ICD-10-CM | POA: Diagnosis not present

## 2022-01-29 DIAGNOSIS — K760 Fatty (change of) liver, not elsewhere classified: Secondary | ICD-10-CM | POA: Diagnosis not present

## 2022-01-29 DIAGNOSIS — R109 Unspecified abdominal pain: Secondary | ICD-10-CM | POA: Diagnosis not present

## 2022-02-02 DIAGNOSIS — R109 Unspecified abdominal pain: Secondary | ICD-10-CM | POA: Diagnosis not present

## 2022-02-02 DIAGNOSIS — N200 Calculus of kidney: Secondary | ICD-10-CM | POA: Diagnosis not present

## 2022-02-11 DIAGNOSIS — E785 Hyperlipidemia, unspecified: Secondary | ICD-10-CM | POA: Diagnosis not present

## 2022-02-11 DIAGNOSIS — E1169 Type 2 diabetes mellitus with other specified complication: Secondary | ICD-10-CM | POA: Diagnosis not present

## 2022-02-11 DIAGNOSIS — I10 Essential (primary) hypertension: Secondary | ICD-10-CM | POA: Diagnosis not present

## 2022-02-19 DIAGNOSIS — N200 Calculus of kidney: Secondary | ICD-10-CM | POA: Diagnosis not present

## 2022-02-25 DIAGNOSIS — I10 Essential (primary) hypertension: Secondary | ICD-10-CM | POA: Diagnosis not present

## 2022-02-25 DIAGNOSIS — E1169 Type 2 diabetes mellitus with other specified complication: Secondary | ICD-10-CM | POA: Diagnosis not present

## 2022-02-25 DIAGNOSIS — E785 Hyperlipidemia, unspecified: Secondary | ICD-10-CM | POA: Diagnosis not present

## 2022-03-23 DIAGNOSIS — I10 Essential (primary) hypertension: Secondary | ICD-10-CM | POA: Diagnosis not present

## 2022-03-23 DIAGNOSIS — M25551 Pain in right hip: Secondary | ICD-10-CM | POA: Diagnosis not present

## 2022-03-23 DIAGNOSIS — E1169 Type 2 diabetes mellitus with other specified complication: Secondary | ICD-10-CM | POA: Diagnosis not present

## 2022-03-23 DIAGNOSIS — E785 Hyperlipidemia, unspecified: Secondary | ICD-10-CM | POA: Diagnosis not present

## 2022-03-25 DIAGNOSIS — M25551 Pain in right hip: Secondary | ICD-10-CM | POA: Diagnosis not present

## 2022-03-29 ENCOUNTER — Other Ambulatory Visit: Payer: Self-pay | Admitting: Sports Medicine

## 2022-03-29 ENCOUNTER — Ambulatory Visit
Admission: RE | Admit: 2022-03-29 | Discharge: 2022-03-29 | Disposition: A | Discharge status: Home / Self Care | Payer: HMO | Source: Ambulatory Visit | Attending: Sports Medicine | Admitting: Sports Medicine

## 2022-03-29 DIAGNOSIS — M25551 Pain in right hip: Secondary | ICD-10-CM

## 2022-03-29 DIAGNOSIS — M545 Low back pain, unspecified: Secondary | ICD-10-CM

## 2022-03-29 DIAGNOSIS — M1611 Unilateral primary osteoarthritis, right hip: Secondary | ICD-10-CM | POA: Diagnosis not present

## 2022-03-29 DIAGNOSIS — M5136 Other intervertebral disc degeneration, lumbar region: Secondary | ICD-10-CM | POA: Diagnosis not present

## 2022-03-30 ENCOUNTER — Ambulatory Visit (INDEPENDENT_AMBULATORY_CARE_PROVIDER_SITE_OTHER): Payer: HMO | Admitting: Podiatry

## 2022-03-30 ENCOUNTER — Ambulatory Visit: Payer: HMO

## 2022-03-30 ENCOUNTER — Encounter: Payer: Self-pay | Admitting: Podiatry

## 2022-03-30 DIAGNOSIS — M79674 Pain in right toe(s): Secondary | ICD-10-CM | POA: Diagnosis not present

## 2022-03-30 DIAGNOSIS — L84 Corns and callosities: Secondary | ICD-10-CM | POA: Diagnosis not present

## 2022-03-30 DIAGNOSIS — B351 Tinea unguium: Secondary | ICD-10-CM

## 2022-03-30 DIAGNOSIS — E1142 Type 2 diabetes mellitus with diabetic polyneuropathy: Secondary | ICD-10-CM

## 2022-03-30 DIAGNOSIS — M79675 Pain in left toe(s): Secondary | ICD-10-CM | POA: Diagnosis not present

## 2022-03-30 DIAGNOSIS — M2041 Other hammer toe(s) (acquired), right foot: Secondary | ICD-10-CM

## 2022-03-30 NOTE — Progress Notes (Signed)
SITUATION ?Reason for Consult: Evaluation for Prefabricated Diabetic Shoes and Custom Diabetic Inserts. ?Patient / Caregiver Report: Patient would like well fitting shoes ? ?OBJECTIVE DATA: ?Patient History / Diagnosis:  ?  ICD-10-CM   ?1. Diabetic peripheral neuropathy associated with type 2 diabetes mellitus ([place])  E11.42   ?  ?2. Acquired hammertoes of both feet  M20.41   ? M20.42   ?  ?3. Callus  L84   ?  ? ? ?Physician Treating Diabetes:  Leeroy Cha, MD ? ?Current or Previous Devices:   Current user ? ?In-Person Foot Examination: ?Ulcers & Callousing:   Callusing on toes ?Deformities:    Hammertoes ?Sensation:    Compromised  ?Shoe Size:     8.5W ? ?ORTHOTIC RECOMMENDATION ?Recommended Devices: ?- 1x pair prefabricated PDAC approved diabetic shoes; Patient Selected [employer] [address] Light Grey Size 8.5W ?- 3x pair custom-to-patient PDAC approved vacuum formed diabetic insoles. ? ?GOALS OF SHOES AND INSOLES ?- Reduce shear and pressure ?- Reduce / Prevent callus formation ?- Reduce / Prevent ulceration ?- Protect the fragile healing compromised diabetic foot. ? ?Patient would benefit from diabetic shoes and inserts as patient has diabetes mellitus and the patient has one or more of the following conditions: ?- History of partial or complete amputation of the foot ?- History of previous foot ulceration. ?- History of pre-ulcerative callus ?- Peripheral neuropathy with evidence of callus formation ?- Foot deformity ?- Poor circulation ? ?ACTIONS PERFORMED ?Potential out of pocket cost was communicated to patient. Patient understood and consented to measurement and casting. Patient was casted for insoles via crush box and measured for shoes via brannock device. Procedure was explained and patient tolerated procedure well. All questions were answered and concerns addressed. Casts were shipped to central fabrication for HOLD until Certificate of Medical Necessity or otherwise necessary authorization  from insurance is obtained. ? ?PLAN ?Shoes are to be ordered and casts released from hold once all appropriate paperwork is complete. Patient is to be contacted and scheduled for fitting once shoes and insoles have been fabricated and received. ? ?

## 2022-03-31 DIAGNOSIS — M25651 Stiffness of right hip, not elsewhere classified: Secondary | ICD-10-CM | POA: Diagnosis not present

## 2022-03-31 DIAGNOSIS — R531 Weakness: Secondary | ICD-10-CM | POA: Diagnosis not present

## 2022-03-31 DIAGNOSIS — R2689 Other abnormalities of gait and mobility: Secondary | ICD-10-CM | POA: Diagnosis not present

## 2022-03-31 DIAGNOSIS — M25551 Pain in right hip: Secondary | ICD-10-CM | POA: Diagnosis not present

## 2022-04-04 NOTE — Progress Notes (Signed)
?  Subjective:  ?Patient ID: [name], female    DOB: Jun 04, 1951,  MRN: [id number] ? ?SAINA WAAGE presents to clinic today for at risk foot care with history of diabetic neuropathy and painful porokeratotic lesion(s) left foot and painful mycotic toenails that limit ambulation. Painful toenails interfere with ambulation. Aggravating factors include wearing enclosed shoe gear. Pain is relieved with periodic professional debridement. Painful porokeratotic lesions are aggravated when weightbearing with and without shoegear. Pain is relieved with periodic professional debridement. ? ?Patient did not check blood glucose today. ? ?New problem(s): None.  ? ?PCP is Leeroy Cha, MD , and last visit was March 23, 2021. ? ?No Known Allergies ? ?Review of Systems: Negative except as noted in the HPI. ? ?Objective: No changes noted in today's physical examination. ?[name] is a pleasant 71 y.o. female in NAD. AAO X 3. ? ?Vascular Examination: ?CFT immediate b/l LE. Palpable DP/PT pulses b/l LE. Digital hair absent b/l. Skin temperature gradient WNL b/l. No pain with calf compression b/l. No edema noted b/l. No cyanosis or clubbing noted b/l LE. ? ?Dermatological Examination: ?Pedal integument with normal turgor, texture and tone b/l LE. No open wounds b/l. No interdigital macerations b/l. Toenails 1-5 b/l elongated, thickened, discolored with subungual debris. +Tenderness with dorsal palpation of nailplates. Porokeratotic lesion(s) noted submet head 5 left foot. ? ?Musculoskeletal Examination: ?Normal muscle strength 5/5 to all lower extremity muscle groups bilaterally. Hammertoe deformity noted 2-5 b/l.Marland Kitchen No pain, crepitus or joint limitation noted with ROM b/l LE.  Patient ambulates independently without assistive aids. ? ?Neurological Examination: ?Pt has subjective symptoms of neuropathy. Protective sensation intact 5/5 intact bilaterally with 10g monofilament b/l. Vibratory sensation intact  b/l. ? ?Assessment/Plan: ?1. Pain due to onychomycosis of toenails of both feet   ?2. Callus   ?3. Diabetic peripheral neuropathy associated with type 2 diabetes mellitus ([place])   ?-No new findings. No new orders. ?-Continue diabetic shoes daily. ?-Toenails 1-5 b/l were debrided in length and girth with sterile nail nippers and dremel without iatrogenic bleeding.  ?-Callus(es) submet head 5 left foot pared utilizing sterile scalpel blade without complication or incident. Total number debrided =1. ?-Patient/POA to call should there be question/concern in the interim.  ? ?Return in about 3 months (around 06/29/2022). ? ?Marzetta Board, DPM  ?

## 2022-04-07 ENCOUNTER — Other Ambulatory Visit: Payer: Self-pay | Admitting: Podiatry

## 2022-04-07 DIAGNOSIS — M2041 Other hammer toe(s) (acquired), right foot: Secondary | ICD-10-CM

## 2022-04-07 DIAGNOSIS — R2689 Other abnormalities of gait and mobility: Secondary | ICD-10-CM | POA: Diagnosis not present

## 2022-04-07 DIAGNOSIS — M25651 Stiffness of right hip, not elsewhere classified: Secondary | ICD-10-CM | POA: Diagnosis not present

## 2022-04-07 DIAGNOSIS — E1142 Type 2 diabetes mellitus with diabetic polyneuropathy: Secondary | ICD-10-CM

## 2022-04-07 DIAGNOSIS — L84 Corns and callosities: Secondary | ICD-10-CM

## 2022-04-07 DIAGNOSIS — M25551 Pain in right hip: Secondary | ICD-10-CM | POA: Diagnosis not present

## 2022-04-07 DIAGNOSIS — R531 Weakness: Secondary | ICD-10-CM | POA: Diagnosis not present

## 2022-04-22 DIAGNOSIS — M545 Low back pain, unspecified: Secondary | ICD-10-CM | POA: Diagnosis not present

## 2022-05-14 DIAGNOSIS — I10 Essential (primary) hypertension: Secondary | ICD-10-CM | POA: Diagnosis not present

## 2022-05-14 DIAGNOSIS — E785 Hyperlipidemia, unspecified: Secondary | ICD-10-CM | POA: Diagnosis not present

## 2022-05-14 DIAGNOSIS — E1169 Type 2 diabetes mellitus with other specified complication: Secondary | ICD-10-CM | POA: Diagnosis not present

## 2022-07-05 ENCOUNTER — Encounter: Payer: Self-pay | Admitting: Podiatry

## 2022-07-05 ENCOUNTER — Ambulatory Visit (INDEPENDENT_AMBULATORY_CARE_PROVIDER_SITE_OTHER): Payer: PPO | Admitting: Podiatry

## 2022-07-05 DIAGNOSIS — E1142 Type 2 diabetes mellitus with diabetic polyneuropathy: Secondary | ICD-10-CM | POA: Diagnosis not present

## 2022-07-05 DIAGNOSIS — M79675 Pain in left toe(s): Secondary | ICD-10-CM | POA: Diagnosis not present

## 2022-07-05 DIAGNOSIS — M79674 Pain in right toe(s): Secondary | ICD-10-CM | POA: Diagnosis not present

## 2022-07-05 DIAGNOSIS — M67479 Ganglion, unspecified ankle and foot: Secondary | ICD-10-CM

## 2022-07-05 DIAGNOSIS — B351 Tinea unguium: Secondary | ICD-10-CM

## 2022-07-05 DIAGNOSIS — L84 Corns and callosities: Secondary | ICD-10-CM

## 2022-07-05 NOTE — Progress Notes (Signed)
  Subjective:  Patient ID: [name], female    DOB: [birth year],  MRN: [id number]  [name] presents to clinic today for at risk foot care with history of diabetic neuropathy and callus(es) left lower extremity and painful thick toenails that are difficult to trim. Painful toenails interfere with ambulation. Aggravating factors include wearing enclosed shoe gear. Pain is relieved with periodic professional debridement. Painful calluses are aggravated when weightbearing with and without shoegear. Pain is relieved with periodic professional debridement.  Last known HgA1c was unknown.  Patient did not check blood glucose today.  New problem(s): Patient has growth at distal joint of right 3rd digit which is new to her and she was wondering what it is. It is not painful. She denies any recent/remote episodes of trauma to the digit.  PCP is Michelle Cha, MD , and last visit was  April, 2023.  No Known Allergies  Review of Systems: Negative except as noted in the HPI.  Objective:  [name] is a pleasant 71 y.o. female in NAD. AAO X 3.  Vascular Examination: CFT immediate b/l LE. Palpable DP/PT pulses b/l LE. Digital hair absent b/l. Skin temperature gradient WNL b/l. No pain with calf compression b/l. No edema noted b/l. No cyanosis or clubbing noted b/l LE.  Dermatological Examination: Pedal integument with normal turgor, texture and tone b/l LE. No open wounds b/l. No interdigital macerations b/l. Toenails 1-5 b/l elongated, thickened, discolored with subungual debris. +Tenderness with dorsal palpation of nailplates. Hyperkeratotic lesion(s) noted submet head 5 left foot.  Musculoskeletal Examination: Normal muscle strength 5/5 to all lower extremity muscle groups bilaterally. Hammertoe deformity noted 2-5 b/l.Marland Kitchen No pain, crepitus or joint limitation noted with ROM b/l LE.  Patient ambulates independently without assistive aids.  She has raised flesh colored growth  noted on the dorsolateral aspect of the right 3rd digit DIPJ consistent with digital mucous cyst. It is fixed and firm. No erythema, no edema, no drainage, non-pulsatile and non-fluctuant.  Neurological Examination: Pt has subjective symptoms of neuropathy. Protective sensation intact 5/5 intact bilaterally with 10g monofilament b/l. Vibratory sensation intact b/l.  Assessment/Plan: 1. Pain due to onychomycosis of toenails of both feet   2. Callus   3. Digital mucous cyst of toe   4. Diabetic peripheral neuropathy associated with type 2 diabetes mellitus ([place])     -Patient was evaluated and treated. All patient's and/or POA's questions/concerns answered on today's visit. -Discussed digital mucous cyst. Presently asymptomatic. Avoid tight shoes and trauma to digit. Monitor for now. Revisit should it become symptomatic. -Continue diabetic shoes daily. -Toenails 1-5 b/l were debrided in length and girth with sterile nail nippers and dremel without iatrogenic bleeding.  -Callus(es) submet head 5 left foot pared utilizing sterile scalpel blade without complication or incident. Total number debrided =1. -Patient/POA to call should there be question/concern in the interim.   Return in about 3 months (around 10/05/2022).  Marzetta Board, [name]

## 2022-07-26 ENCOUNTER — Ambulatory Visit (INDEPENDENT_AMBULATORY_CARE_PROVIDER_SITE_OTHER): Payer: HMO | Admitting: *Deleted

## 2022-07-26 DIAGNOSIS — M2041 Other hammer toe(s) (acquired), right foot: Secondary | ICD-10-CM

## 2022-07-26 DIAGNOSIS — L84 Corns and callosities: Secondary | ICD-10-CM | POA: Diagnosis not present

## 2022-07-26 DIAGNOSIS — E1142 Type 2 diabetes mellitus with diabetic polyneuropathy: Secondary | ICD-10-CM

## 2022-07-26 DIAGNOSIS — M2042 Other hammer toe(s) (acquired), left foot: Secondary | ICD-10-CM | POA: Diagnosis not present

## 2022-07-26 NOTE — Progress Notes (Signed)
Patient presents today to pick up diabetic shoes and insoles.  Patient was dispensed 1 pair of diabetic shoes and 3 pairs of foam casted diabetic insoles. Fit was satisfactory. Instructions for break-in and wear was reviewed and a copy was given to the patient.   Re-appointment for regularly scheduled diabetic foot care visits or if they should experience any trouble with the shoes or insoles.  

## 2022-07-28 DIAGNOSIS — M71342 Other bursal cyst, left hand: Secondary | ICD-10-CM | POA: Diagnosis not present

## 2022-07-28 DIAGNOSIS — L821 Other seborrheic keratosis: Secondary | ICD-10-CM | POA: Diagnosis not present

## 2022-07-28 DIAGNOSIS — L918 Other hypertrophic disorders of the skin: Secondary | ICD-10-CM | POA: Diagnosis not present

## 2022-07-28 DIAGNOSIS — L570 Actinic keratosis: Secondary | ICD-10-CM | POA: Diagnosis not present

## 2022-07-28 DIAGNOSIS — Z85828 Personal history of other malignant neoplasm of skin: Secondary | ICD-10-CM | POA: Diagnosis not present

## 2022-07-28 DIAGNOSIS — M71341 Other bursal cyst, right hand: Secondary | ICD-10-CM | POA: Diagnosis not present

## 2022-07-28 DIAGNOSIS — Z85820 Personal history of malignant melanoma of skin: Secondary | ICD-10-CM | POA: Diagnosis not present

## 2022-07-28 DIAGNOSIS — B078 Other viral warts: Secondary | ICD-10-CM | POA: Diagnosis not present

## 2022-09-27 DIAGNOSIS — I10 Essential (primary) hypertension: Secondary | ICD-10-CM | POA: Diagnosis not present

## 2022-09-27 DIAGNOSIS — E118 Type 2 diabetes mellitus with unspecified complications: Secondary | ICD-10-CM | POA: Diagnosis not present

## 2022-10-05 ENCOUNTER — Ambulatory Visit (INDEPENDENT_AMBULATORY_CARE_PROVIDER_SITE_OTHER): Payer: HMO

## 2022-10-05 ENCOUNTER — Encounter: Payer: Self-pay | Admitting: Podiatry

## 2022-10-05 ENCOUNTER — Ambulatory Visit (INDEPENDENT_AMBULATORY_CARE_PROVIDER_SITE_OTHER): Payer: HMO | Admitting: Podiatry

## 2022-10-05 DIAGNOSIS — E1142 Type 2 diabetes mellitus with diabetic polyneuropathy: Secondary | ICD-10-CM

## 2022-10-05 DIAGNOSIS — S90851A Superficial foreign body, right foot, initial encounter: Secondary | ICD-10-CM

## 2022-10-05 DIAGNOSIS — B351 Tinea unguium: Secondary | ICD-10-CM

## 2022-10-05 DIAGNOSIS — M79674 Pain in right toe(s): Secondary | ICD-10-CM

## 2022-10-05 DIAGNOSIS — M79675 Pain in left toe(s): Secondary | ICD-10-CM

## 2022-10-05 DIAGNOSIS — L923 Foreign body granuloma of the skin and subcutaneous tissue: Secondary | ICD-10-CM | POA: Diagnosis not present

## 2022-10-05 DIAGNOSIS — L84 Corns and callosities: Secondary | ICD-10-CM | POA: Diagnosis not present

## 2022-10-05 NOTE — Progress Notes (Signed)
Subjective:  Patient ID: Joycie Peek, female    DOB: [birth year],  MRN: [id number]  BRALYN ESPINO presents to clinic today for:  Chief Complaint  Patient presents with   Nail Problem    Diabetic foot care BS-did not check today A1C-7.1 PCP-Varadarajan PCP VST-Last Monday      New problem(s): Patient states she feels something in her right arch area. She does not remember stepping on anything or breaking any glass in her home. States she does have neuropathy and may not have initially felt it when she stepped on it. 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   PCP is Leeroy Cha, MD , and last visit was 10/28/2022.  No Known Allergies  Review of Systems: Negative except as noted in the HPI.  Objective:   LAKESA COSTE is a pleasant 71 y.o. female in NAD. AAO x 3.  Vascular Examination: CFT immediate b/l LE. Palpable DP/PT pulses b/l LE. Digital hair absent b/l. Skin temperature gradient WNL b/l. No pain with calf compression b/l. No edema noted b/l. No cyanosis or clubbing noted b/l LE.  Dermatological Examination: Pedal integument with normal turgor, texture and tone b/l LE. No open wounds b/l. No interdigital macerations b/l. Toenails 1-5 b/l elongated, thickened, discolored with subungual debris. +Tenderness with dorsal palpation of nailplates.   Hyperkeratotic lesion(s) noted submet head 5 left foot.  Palpable and visible object noted plantar aspect of arch right foot. Excision revealed small portion of a prickly ball. It did not extend deep into subcutaneous tissue. No erythema, no edema, no drainage, no fluctuance.     Musculoskeletal Examination: Normal muscle strength 5/5 to all lower extremity muscle groups bilaterally. Hammertoe deformity noted 2-5 b/l.Marland Kitchen No pain, crepitus or joint limitation noted with ROM b/l LE.  Patient ambulates independently without assistive aids.  She has  raised flesh colored growth noted on the dorsolateral aspect of the right 3rd digit DIPJ consistent with digital mucous cyst. It is fixed and firm. No erythema, no edema, no drainage, non-pulsatile and non-fluctuant.  Neurological Examination: Pt has subjective symptoms of neuropathy. Protective sensation intact 5/5 intact bilaterally with 10g monofilament b/l. Vibratory sensation intact b/l.  Xray findings right foot: No gas in tissues right foot. Plantar calcaneal spur noted right foot. Posterior calcaneal spur noted right foot. No evidence of fracture right foot. No foreign body evident right foot.  Assessment/Plan: 1. Pain due to onychomycosis of toenails of both feet   2. Foreign body in right foot, initial encounter   3. Callus   4. Diabetic peripheral neuropathy associated with type 2 diabetes mellitus ([place])     No orders of the defined types were placed in this encounter.   -Consent given for treatment as described below: -Examined patient. -Betadine prep performed. Excision of FBO of right foot achieved with no bleeding. Area cleansed with alcohol. Triple antibiotic ointment and band-aid applied. Patient to apply Neosporin to area once daily for one week. Call office if she has any problems. -Mycotic toenails 1-5 bilaterally were debrided in length and girth with sterile nail nippers and dremel without incident. -Callus(es) submet head 5 left foot pared utilizing sterile scalpel blade without complication or incident. Total number debrided =1. -Xray of right foot was performed and reviewed with patient and/or POA. -Patient/POA to call should there be question/concern in the interim.   Return in about 3 months (around 01/05/2023).  Marzetta Board, DPM

## 2022-10-19 ENCOUNTER — Other Ambulatory Visit: Payer: Self-pay | Admitting: Internal Medicine

## 2022-10-19 ENCOUNTER — Ambulatory Visit
Admission: RE | Admit: 2022-10-19 | Discharge: 2022-10-19 | Disposition: A | Discharge status: Home / Self Care | Payer: HMO | Source: Ambulatory Visit | Attending: Internal Medicine | Admitting: Internal Medicine

## 2022-10-19 DIAGNOSIS — J40 Bronchitis, not specified as acute or chronic: Secondary | ICD-10-CM

## 2022-11-15 ENCOUNTER — Other Ambulatory Visit: Payer: Self-pay | Admitting: Internal Medicine

## 2022-11-15 ENCOUNTER — Ambulatory Visit
Admission: RE | Admit: 2022-11-15 | Discharge: 2022-11-15 | Disposition: A | Discharge status: Home / Self Care | Payer: HMO | Source: Ambulatory Visit | Attending: Internal Medicine | Admitting: Internal Medicine

## 2022-11-15 DIAGNOSIS — J45909 Unspecified asthma, uncomplicated: Secondary | ICD-10-CM

## 2023-01-12 DIAGNOSIS — H40013 Open angle with borderline findings, low risk, bilateral: Secondary | ICD-10-CM | POA: Diagnosis not present

## 2023-01-12 DIAGNOSIS — E119 Type 2 diabetes mellitus without complications: Secondary | ICD-10-CM | POA: Diagnosis not present

## 2023-01-12 DIAGNOSIS — Z961 Presence of intraocular lens: Secondary | ICD-10-CM | POA: Diagnosis not present

## 2023-01-21 ENCOUNTER — Ambulatory Visit: Payer: HMO | Admitting: Podiatry

## 2023-01-31 ENCOUNTER — Ambulatory Visit (INDEPENDENT_AMBULATORY_CARE_PROVIDER_SITE_OTHER): Payer: HMO | Admitting: Podiatry

## 2023-01-31 ENCOUNTER — Encounter: Payer: Self-pay | Admitting: Podiatry

## 2023-01-31 DIAGNOSIS — M79674 Pain in right toe(s): Secondary | ICD-10-CM

## 2023-01-31 DIAGNOSIS — M79675 Pain in left toe(s): Secondary | ICD-10-CM | POA: Diagnosis not present

## 2023-01-31 DIAGNOSIS — E1142 Type 2 diabetes mellitus with diabetic polyneuropathy: Secondary | ICD-10-CM

## 2023-01-31 DIAGNOSIS — Z85828 Personal history of other malignant neoplasm of skin: Secondary | ICD-10-CM | POA: Diagnosis not present

## 2023-01-31 DIAGNOSIS — Z85820 Personal history of malignant melanoma of skin: Secondary | ICD-10-CM | POA: Diagnosis not present

## 2023-01-31 DIAGNOSIS — B351 Tinea unguium: Secondary | ICD-10-CM | POA: Diagnosis not present

## 2023-01-31 DIAGNOSIS — L570 Actinic keratosis: Secondary | ICD-10-CM | POA: Diagnosis not present

## 2023-01-31 NOTE — Progress Notes (Signed)
  Subjective:  Patient ID: [name], female    DOB: 08-May-1951,   MRN: [id number]  Chief Complaint  Patient presents with   Nail Problem    [facility]    72 y.o. female presents for concern of thickened elongated and painful nails that are difficult to trim. Requesting to have them trimmed today. Relates burning and tingling in their feet. Patient is diabetic and last A1c was  Lab Results  Component Value Date   HGBA1C (H) 01/12/2011    7.3 (NOTE)                                                                       According to the ADA Clinical Practice Recommendations for 2011, when HbA1c is used as a screening test:   >=6.5%   Diagnostic of Diabetes Mellitus           (if abnormal result  is confirmed)  5.7-6.4%   Increased risk of developing Diabetes Mellitus  References:Diagnosis and Classification of Diabetes Mellitus,Diabetes KDTO,6712,45(YKDXI 1):S62-S69 and Standards of Medical Care in         Diabetes - 2011,Diabetes Care,2011,34  (Suppl 1):S11-S61.   Marland Kitchen   PCP:  Leeroy Cha, MD    . Denies any other pedal complaints. Denies n/v/f/c.   Past Medical History:  Diagnosis Date   Diabetes mellitus without complication ([place])    Hypertension     Objective:  Physical Exam: Vascular: DP/PT pulses 2/4 bilateral. CFT <3 seconds. Absent hair growth on digits. Edema noted to bilateral lower extremities. Xerosis noted bilaterally.  Skin. No lacerations or abrasions bilateral feet. Nails 1-5 bilateral  are thickened discolored and elongated with subungual debris.  Musculoskeletal: MMT 5/5 bilateral lower extremities in DF, PF, Inversion and Eversion. Deceased ROM in DF of ankle joint.  Neurological: Sensation intact to light touch. Protective sensation diminished bilateral.    Assessment:   1. Pain due to onychomycosis of toenails of both feet   2. Diabetic peripheral neuropathy associated with type 2 diabetes mellitus ([place])      Plan:  Patient was evaluated and treated  and all questions answered. -Discussed and educated patient on diabetic foot care, especially with  regards to the vascular, neurological and musculoskeletal systems.  -Stressed the importance of good glycemic control and the detriment of not  controlling glucose levels in relation to the foot. -Discussed supportive shoes at all times and checking feet regularly.  -Mechanically debrided all nails 1-5 bilateral using sterile nail nipper and filed with dremel without incident  -Answered all patient questions -Patient to return  in 3 months for at risk foot care -Patient advised to call the office if any problems or questions arise in the meantime.   Lorenda Peck, DPM

## 2023-03-29 DIAGNOSIS — E119 Type 2 diabetes mellitus without complications: Secondary | ICD-10-CM | POA: Diagnosis not present

## 2023-03-29 DIAGNOSIS — I10 Essential (primary) hypertension: Secondary | ICD-10-CM | POA: Diagnosis not present

## 2023-03-29 DIAGNOSIS — S61419A Laceration without foreign body of unspecified hand, initial encounter: Secondary | ICD-10-CM | POA: Diagnosis not present

## 2023-03-29 DIAGNOSIS — E1169 Type 2 diabetes mellitus with other specified complication: Secondary | ICD-10-CM | POA: Diagnosis not present

## 2023-05-11 ENCOUNTER — Ambulatory Visit (INDEPENDENT_AMBULATORY_CARE_PROVIDER_SITE_OTHER): Payer: PPO | Admitting: Podiatry

## 2023-05-11 DIAGNOSIS — L84 Corns and callosities: Secondary | ICD-10-CM | POA: Diagnosis not present

## 2023-05-11 DIAGNOSIS — B351 Tinea unguium: Secondary | ICD-10-CM | POA: Diagnosis not present

## 2023-05-11 DIAGNOSIS — E119 Type 2 diabetes mellitus without complications: Secondary | ICD-10-CM | POA: Diagnosis not present

## 2023-05-11 DIAGNOSIS — M2042 Other hammer toe(s) (acquired), left foot: Secondary | ICD-10-CM

## 2023-05-11 DIAGNOSIS — M79675 Pain in left toe(s): Secondary | ICD-10-CM | POA: Diagnosis not present

## 2023-05-11 DIAGNOSIS — M2041 Other hammer toe(s) (acquired), right foot: Secondary | ICD-10-CM

## 2023-05-11 DIAGNOSIS — E1142 Type 2 diabetes mellitus with diabetic polyneuropathy: Secondary | ICD-10-CM | POA: Diagnosis not present

## 2023-05-11 DIAGNOSIS — M79674 Pain in right toe(s): Secondary | ICD-10-CM | POA: Diagnosis not present

## 2023-05-11 NOTE — Progress Notes (Deleted)
ANNUAL DIABETIC FOOT EXAM  Subjective: [name] presents today annual diabetic foot exam.  Chief Complaint  Patient presents with   Nail Problem    [facility] BS-128 yesterday A1C-7.0 PCP-Varadarajan PCP VST-03/2023   Patient confirms h/o diabetes.  Patient denies any h/o foot wounds.  Patient has been diagnosed with neuropathy.  Risk factors: diabetes, HTN, dyslipidemia.  Lorenda Ishihara, MD is patient's PCP.  Past Medical History:  Diagnosis Date   Diabetes mellitus without complication (HCC)    Hypertension    Patient Active Problem List   Diagnosis Date Noted   Varicose veins of bilateral lower extremities with other complications 06/17/2021   Dyslipidemia 12/03/2020   Essential hypertension 12/03/2020   Kidney stone 12/03/2020   Malignant melanoma (HCC) 12/03/2020   Obesity 12/03/2020   Other abnormal and inconclusive findings on diagnostic imaging of breast 12/03/2020   Skin sensation disturbance 12/03/2020   Skin cancer 12/03/2020   Type 2 diabetes mellitus with other specified complication (HCC) 12/03/2020   Past Surgical History:  Procedure Laterality Date   ABDOMINAL HYSTERECTOMY     BACK SURGERY     HERNIA REPAIR     JOINT REPLACEMENT     SKIN CANCER EXCISION     on the face and arms   Current Outpatient Medications on File Prior to Visit  Medication Sig Dispense Refill   augmented betamethasone dipropionate (DIPROLENE-AF) 0.05 % cream      Blood Glucose Monitoring Suppl (ONE TOUCH ULTRA 2) w/Device KIT U TO TEST BLOOD SUGAR UTD     cholecalciferol (VITAMIN D) 1000 units tablet Take 1,000 Units by mouth daily.     D 1000 25 MCG (1000 UT) capsule SMARTSIG:1 By Mouth     doxycycline (VIBRAMYCIN) 100 MG capsule TK 1 C PO BID WF     empagliflozin (JARDIANCE) 25 MG TABS tablet Take 25 mg by mouth daily.     fluorouracil (EFUDEX) 5 % cream Apply 1 a small amount to affected area twice a day     Lancet Devices (ONE TOUCH DELICA LANCING DEV) MISC  See admin instructions.     Lancets (ONETOUCH DELICA PLUS LANCET30G) MISC USE AS DIRECTED USE FOR CHECKING BLOOD SUGARS ONCE DAILY     lisinopril-hydrochlorothiazide (PRINZIDE,ZESTORETIC) 10-12.5 MG tablet Take 1 tablet by mouth daily.     meclizine (ANTIVERT) 12.5 MG tablet 1 tablet as needed     meloxicam (MOBIC) 7.5 MG tablet 1 tablet     metFORMIN (GLUCOPHAGE-XR) 500 MG 24 hr tablet Take 500 mg by mouth daily with breakfast. Take 4 tablets at one time during my evening meal; this equals 2000 mg every day     Multiple Vitamin (MULTIVITAMIN) tablet Take 1 tablet by mouth daily.     mupirocin ointment (BACTROBAN) 2 % Apply 1 application. topically daily.     ofloxacin (OCUFLOX) 0.3 % ophthalmic solution Place 1 drop into the right eye 4 (four) times daily.     ONE TOUCH ULTRA TEST test strip      prednisoLONE acetate (PRED FORTE) 1 % ophthalmic suspension Place 1 drop into the right eye 4 (four) times daily.     predniSONE (STERAPRED UNI-PAK 21 TAB) 10 MG (21) TBPK tablet TAKE AS DIRECTED FOR 6 DAYS     Probiotic, Lactobacillus, CAPS SMARTSIG:1 By Mouth     PROLENSA 0.07 % SOLN Place 1 drop into the right eye daily.     Semaglutide,0.25 or 0.5MG /DOS, (OZEMPIC, 0.25 OR 0.5 MG/DOSE,) 2 MG/1.5ML SOPN 0.25 mg  x 4 weeks then 0.5 mg     simvastatin (ZOCOR) 10 MG tablet Take 10 mg by mouth daily.     triamcinolone (KENALOG) 0.1 % Apply 1 application topically 2 (two) times daily as needed.     valACYclovir (VALTREX) 1000 MG tablet TK 1 T PO TID FOR 1 WK UTD     vitamin B-12 (CYANOCOBALAMIN) 100 MCG tablet Take 100 mcg by mouth daily.     No current facility-administered medications on file prior to visit.    No Known Allergies Social History   Occupational History   Not on file  Tobacco Use   Smoking status: Never   Smokeless tobacco: Never  Substance and Sexual Activity   Alcohol use: No   Drug use: No   Sexual activity: Not on file   No family history on file. Immunization History   Administered Date(s) Administered   Influenza Split 12/06/2015, 09/18/2016   PFIZER(Purple Top)SARS-COV-2 Vaccination 02/18/2020, 03/10/2020   Pneumococcal Conjugate-13 08/21/2015   Pneumococcal Polysaccharide-23 10/27/2016   Tdap 06/26/2016     Review of Systems: Negative except as noted in the HPI.   Objective: There were no vitals filed for this visit.  [name] is a pleasant 72 y.o. female in NAD. AAO X 3.  Vascular Examination: Capillary refill time immediate b/l. Vascular status intact b/l with palpable pedal pulses. Pedal hair sparse  b/l. No pain with calf compression b/l. Skin temperature gradient WNL b/l. No cyanosis or clubbing b/l. No ischemia or gangrene noted b/l.   Neurological Examination: Sensation grossly intact b/l with 10 gram monofilament. Vibratory sensation intact b/l. Pt has subjective symptoms of neuropathy.  Dermatological Examination: Pedal skin with normal turgor, texture and tone b/l.  No open wounds. No interdigital macerations.   Toenails 1-5 b/l thick, discolored, elongated with subungual debris and pain on dorsal palpation.   {jgderm:23598}  Musculoskeletal Examination: {jgmsk:23600}  Radiographs: None  {jgxrayfindings:23683}  Last A1c:       No data to display          Lab Results  Component Value Date   HGBA1C (H) 01/12/2011    7.3 (NOTE)                                                                       According to the ADA Clinical Practice Recommendations for 2011, when HbA1c is used as a screening test:   >=6.5%   Diagnostic of Diabetes Mellitus           (if abnormal result  is confirmed)  5.7-6.4%   Increased risk of developing Diabetes Mellitus  References:Diagnosis and Classification of Diabetes Mellitus,Diabetes Care,2011,34(Suppl 1):S62-S69 and Standards of Medical Care in         Diabetes - 2011,Diabetes Care,2011,34  (Suppl 1):S11-S61.   No results found. ADA Risk Categorization: Low Risk :  Patient has  all of the following: Intact protective sensation No prior foot ulcer  No severe deformity Pedal pulses present  High Risk  Patient has one or more of the following: Loss of protective sensation Absent pedal pulses Severe Foot deformity History of foot ulcer  Assessment: 1. Pain due to onychomycosis of toenails of both feet   2. Acquired hammertoes of both feet  3. Callus   4. Diabetic peripheral neuropathy associated with type 2 diabetes mellitus (HCC)   5. Encounter for diabetic foot exam (HCC)     Plan: Orders Placed This Encounter  Procedures   For Home Use Only DME Diabetic Shoe    Dispense one pair extra depth shoes and 3 pair total contact insoles today. Offload calluses submet head 5 bilaterally.   FOR HOME USE ONLY DME DIABETIC SHOE  {jgplan:23602::"-Patient/POA to call should there be question/concern in the interim."} Return in about 3 months (around 08/11/2023).  Freddie Breech, DPM

## 2023-05-15 ENCOUNTER — Encounter: Payer: Self-pay | Admitting: Podiatry

## 2023-05-15 NOTE — Progress Notes (Signed)
ANNUAL DIABETIC FOOT EXAM  Subjective: [name] presents today annual diabetic foot exam.  Chief Complaint  Patient presents with   Nail Problem    [facility] BS-128 yesterday A1C-7.0 PCP-Varadarajan PCP VST-03/2023   Patient confirms h/o diabetes.  Patient denies any h/o foot wounds.  Patient has been diagnosed with neuropathy.  Risk factors: diabetes, diabetic neuropathy, HTN, hyperlipidemia.  Lorenda Ishihara, MD is patient's PCP.  Past Medical History:  Diagnosis Date   Diabetes mellitus without complication (HCC)    Hypertension    Patient Active Problem List   Diagnosis Date Noted   Varicose veins of bilateral lower extremities with other complications 06/17/2021   Dyslipidemia 12/03/2020   Essential hypertension 12/03/2020   Kidney stone 12/03/2020   Malignant melanoma (HCC) 12/03/2020   Obesity 12/03/2020   Other abnormal and inconclusive findings on diagnostic imaging of breast 12/03/2020   Skin sensation disturbance 12/03/2020   Skin cancer 12/03/2020   Type 2 diabetes mellitus with other specified complication (HCC) 12/03/2020   Past Surgical History:  Procedure Laterality Date   ABDOMINAL HYSTERECTOMY     BACK SURGERY     HERNIA REPAIR     JOINT REPLACEMENT     SKIN CANCER EXCISION     on the face and arms   Current Outpatient Medications on File Prior to Visit  Medication Sig Dispense Refill   augmented betamethasone dipropionate (DIPROLENE-AF) 0.05 % cream      Blood Glucose Monitoring Suppl (ONE TOUCH ULTRA 2) w/Device KIT U TO TEST BLOOD SUGAR UTD     cholecalciferol (VITAMIN D) 1000 units tablet Take 1,000 Units by mouth daily.     D 1000 25 MCG (1000 UT) capsule SMARTSIG:1 By Mouth     doxycycline (VIBRAMYCIN) 100 MG capsule TK 1 C PO BID WF     empagliflozin (JARDIANCE) 25 MG TABS tablet Take 25 mg by mouth daily.     fluorouracil (EFUDEX) 5 % cream Apply 1 a small amount to affected area twice a day     Lancet Devices (ONE TOUCH  DELICA LANCING DEV) MISC See admin instructions.     Lancets (ONETOUCH DELICA PLUS LANCET30G) MISC USE AS DIRECTED USE FOR CHECKING BLOOD SUGARS ONCE DAILY     lisinopril-hydrochlorothiazide (PRINZIDE,ZESTORETIC) 10-12.5 MG tablet Take 1 tablet by mouth daily.     meclizine (ANTIVERT) 12.5 MG tablet 1 tablet as needed     meloxicam (MOBIC) 7.5 MG tablet 1 tablet     metFORMIN (GLUCOPHAGE-XR) 500 MG 24 hr tablet Take 500 mg by mouth daily with breakfast. Take 4 tablets at one time during my evening meal; this equals 2000 mg every day     Multiple Vitamin (MULTIVITAMIN) tablet Take 1 tablet by mouth daily.     mupirocin ointment (BACTROBAN) 2 % Apply 1 application. topically daily.     ofloxacin (OCUFLOX) 0.3 % ophthalmic solution Place 1 drop into the right eye 4 (four) times daily.     ONE TOUCH ULTRA TEST test strip      prednisoLONE acetate (PRED FORTE) 1 % ophthalmic suspension Place 1 drop into the right eye 4 (four) times daily.     predniSONE (STERAPRED UNI-PAK 21 TAB) 10 MG (21) TBPK tablet TAKE AS DIRECTED FOR 6 DAYS     Probiotic, Lactobacillus, CAPS SMARTSIG:1 By Mouth     PROLENSA 0.07 % SOLN Place 1 drop into the right eye daily.     Semaglutide,0.25 or 0.5MG /DOS, (OZEMPIC, 0.25 OR 0.5 MG/DOSE,) 2 MG/1.5ML SOPN  0.25 mg x 4 weeks then 0.5 mg     simvastatin (ZOCOR) 10 MG tablet Take 10 mg by mouth daily.     triamcinolone (KENALOG) 0.1 % Apply 1 application topically 2 (two) times daily as needed.     valACYclovir (VALTREX) 1000 MG tablet TK 1 T PO TID FOR 1 WK UTD     vitamin B-12 (CYANOCOBALAMIN) 100 MCG tablet Take 100 mcg by mouth daily.     No current facility-administered medications on file prior to visit.    No Known Allergies Social History   Occupational History   Not on file  Tobacco Use   Smoking status: Never   Smokeless tobacco: Never  Substance and Sexual Activity   Alcohol use: No   Drug use: No   Sexual activity: Not on file   No family history on  file. Immunization History  Administered Date(s) Administered   Influenza Split 12/06/2015, 09/18/2016   PFIZER(Purple Top)SARS-COV-2 Vaccination 02/18/2020, 03/10/2020   Pneumococcal Conjugate-13 08/21/2015   Pneumococcal Polysaccharide-23 10/27/2016   Tdap 06/26/2016     Review of Systems: Negative except as noted in the HPI.   Objective: There were no vitals filed for this visit.  [name] is a pleasant 72 y.o. female in NAD. AAO X 3.  Vascular Examination: Capillary refill time immediate b/l. Vascular status intact b/l with palpable pedal pulses. Pedal hair present b/l. No pain with calf compression b/l. Skin temperature gradient WNL b/l. No cyanosis or clubbing b/l. No ischemia or gangrene noted b/l.   Neurological Examination: Sensation grossly intact b/l with 10 gram monofilament. Vibratory sensation intact b/l. Pt has subjective symptoms of neuropathy.  Dermatological Examination: Pedal skin with normal turgor, texture and tone b/l.  No open wounds. No interdigital macerations.   Toenails 1-5 b/l thick, discolored, elongated with subungual debris and pain on dorsal palpation.   Minimal hyperkeratosis submet head 5 b/l.  Musculoskeletal Examination: Normal muscle strength 5/5 to all lower extremity muscle groups bilaterally. Hammertoe deformity noted 2-5 b/l.Marland Kitchen No pain, crepitus or joint limitation noted with ROM b/l LE.  Patient ambulates independently without assistive aids.  Radiographs: None  Lab Results  Component Value Date   HGBA1C (H) 01/12/2011    7.3 (NOTE)                                                                       According to the ADA Clinical Practice Recommendations for 2011, when HbA1c is used as a screening test:   >=6.5%   Diagnostic of Diabetes Mellitus           (if abnormal result  is confirmed)  5.7-6.4%   Increased risk of developing Diabetes Mellitus  References:Diagnosis and Classification of Diabetes Mellitus,Diabetes  Care,2011,34(Suppl 1):S62-S69 and Standards of Medical Care in         Diabetes - 2011,Diabetes Care,2011,34  (Suppl 1):S11-S61.   ADA Risk Categorization: Low Risk :  Patient has all of the following: Intact protective sensation No prior foot ulcer  No severe deformity Pedal pulses present  Assessment: 1. Pain due to onychomycosis of toenails of both feet   2. Acquired hammertoes of both feet   3. Callus   4. Diabetic peripheral neuropathy associated with type  2 diabetes mellitus (HCC)   5. Encounter for diabetic foot exam (HCC)     Plan: Orders Placed This Encounter  Procedures   For Home Use Only DME Diabetic Shoe    Dispense one pair extra depth shoes and 3 pair total contact insoles today. Offload calluses submet head 5 bilaterally.   FOR HOME USE ONLY DME DIABETIC SHOE  -Patient was evaluated and treated. All patient's and/or POA's questions/concerns answered on today's visit. -Diabetic foot examination performed today. -Discussed diabetic shoe benefit available based on patient's diagnoses. Patient/POA would like to proceed. Order entered for one pair extra depth shoes and 3 pair total contact insoles. Patient qualifies based on diagnoses. -Toenails 1-5 b/l were debrided in length and girth with sterile nail nippers and dremel without iatrogenic bleeding.  -Patient/POA to call should there be question/concern in the interim. Return in about 3 months (around 08/11/2023).  Freddie Breech, DPM

## 2023-05-30 ENCOUNTER — Ambulatory Visit (INDEPENDENT_AMBULATORY_CARE_PROVIDER_SITE_OTHER): Payer: PPO | Admitting: Podiatry

## 2023-05-30 ENCOUNTER — Encounter: Payer: Self-pay | Admitting: Podiatry

## 2023-05-30 DIAGNOSIS — E1142 Type 2 diabetes mellitus with diabetic polyneuropathy: Secondary | ICD-10-CM

## 2023-05-30 DIAGNOSIS — E1169 Type 2 diabetes mellitus with other specified complication: Secondary | ICD-10-CM

## 2023-05-30 NOTE — Progress Notes (Signed)
Patient presents to the office today for diabetic shoe and insole measuring.  Patient was measured with brannock device to determine size and width for 1 pair of extra depth shoes and foam casted for 3 pair of insoles.   ABN signed.   Documentation of medical necessity will be sent to patient's treating diabetic doctor to verify and sign.   Patient's diabetic provider: Lorenda Hansen  Shoes and insoles will be ordered at that time and patient will be notified for an appointment for fitting when they arrive.   Brannock measurement: 8.5 W  Patient shoe selection-   1st   Shoe choice:   [id number]  2nd  Shoe choice:   [id number]  Shoe size ordered: 8.5 Wide

## 2023-07-20 ENCOUNTER — Telehealth: Payer: Self-pay | Admitting: Podiatry

## 2023-07-20 NOTE — Telephone Encounter (Signed)
No  answer no vm  , tried to reach  pt to schedule appt to pick up diabetic shoes

## 2023-07-27 NOTE — Telephone Encounter (Signed)
Tried to reach pt to schedule picking up diabetic shoes no answer , line picks up and hangs up .  Tried both numbers.

## 2023-08-15 ENCOUNTER — Encounter: Payer: Self-pay | Admitting: Podiatry

## 2023-08-15 ENCOUNTER — Ambulatory Visit: Payer: PPO | Admitting: Podiatry

## 2023-08-15 DIAGNOSIS — M79675 Pain in left toe(s): Secondary | ICD-10-CM | POA: Diagnosis not present

## 2023-08-15 DIAGNOSIS — E1142 Type 2 diabetes mellitus with diabetic polyneuropathy: Secondary | ICD-10-CM | POA: Diagnosis not present

## 2023-08-15 DIAGNOSIS — B351 Tinea unguium: Secondary | ICD-10-CM | POA: Diagnosis not present

## 2023-08-15 DIAGNOSIS — M79674 Pain in right toe(s): Secondary | ICD-10-CM

## 2023-08-15 NOTE — Progress Notes (Signed)
  Subjective:  Patient ID: [name], female    DOB: 12-29-1950,   MRN: [id number]  Chief Complaint  Patient presents with   Nail Problem    Pt present today for a diabetic foot care.    72 y.o. female presents for concern of thickened elongated and painful nails that are difficult to trim. Requesting to have them trimmed today. Relates burning and tingling in their feet. Patient is diabetic and last A1c was  Lab Results  Component Value Date   HGBA1C (H) 01/12/2011    7.3 (NOTE)                                                                       According to the ADA Clinical Practice Recommendations for 2011, when HbA1c is used as a screening test:   >=6.5%   Diagnostic of Diabetes Mellitus           (if abnormal result  is confirmed)  5.7-6.4%   Increased risk of developing Diabetes Mellitus  References:Diagnosis and Classification of Diabetes Mellitus,Diabetes Care,2011,34(Suppl 1):S62-S69 and Standards of Medical Care in         Diabetes - 2011,Diabetes Care,2011,34  (Suppl 1):S11-S61.   Marland Kitchen   PCP:  Lorenda Ishihara, MD    . Denies any other pedal complaints. Denies n/v/f/c.   Past Medical History:  Diagnosis Date   Diabetes mellitus without complication (HCC)    Hypertension     Objective:  Physical Exam: Vascular: DP/PT pulses 2/4 bilateral. CFT <3 seconds. Absent hair growth on digits. Edema noted to bilateral lower extremities. Xerosis noted bilaterally.  Skin. No lacerations or abrasions bilateral feet. Nails 1-5 bilateral  are thickened discolored and elongated with subungual debris.  Musculoskeletal: MMT 5/5 bilateral lower extremities in DF, PF, Inversion and Eversion. Deceased ROM in DF of ankle joint.  Neurological: Sensation intact to light touch. Protective sensation diminished bilateral.    Assessment:   1. Pain due to onychomycosis of toenails of both feet   2. Diabetic peripheral neuropathy associated with type 2 diabetes mellitus (HCC)        Plan:  Patient was evaluated and treated and all questions answered. -Discussed and educated patient on diabetic foot care, especially with  regards to the vascular, neurological and musculoskeletal systems.  -Stressed the importance of good glycemic control and the detriment of not  controlling glucose levels in relation to the foot. -Discussed supportive shoes at all times and checking feet regularly.  -Mechanically debrided all nails 1-5 bilateral using sterile nail nipper and filed with dremel without incident  -Answered all patient questions -Patient to return  in 3 months for at risk foot care -Patient advised to call the office if any problems or questions arise in the meantime.   Louann Sjogren, DPM

## 2023-09-13 NOTE — Telephone Encounter (Signed)
Called patient again re: shoes and to please call and set fitting appt

## 2023-09-27 DIAGNOSIS — L821 Other seborrheic keratosis: Secondary | ICD-10-CM | POA: Diagnosis not present

## 2023-09-27 DIAGNOSIS — L814 Other melanin hyperpigmentation: Secondary | ICD-10-CM | POA: Diagnosis not present

## 2023-09-27 DIAGNOSIS — L570 Actinic keratosis: Secondary | ICD-10-CM | POA: Diagnosis not present

## 2023-09-27 DIAGNOSIS — C44319 Basal cell carcinoma of skin of other parts of face: Secondary | ICD-10-CM | POA: Diagnosis not present

## 2023-09-27 DIAGNOSIS — D485 Neoplasm of uncertain behavior of skin: Secondary | ICD-10-CM | POA: Diagnosis not present

## 2023-09-27 DIAGNOSIS — M71371 Other bursal cyst, right ankle and foot: Secondary | ICD-10-CM | POA: Diagnosis not present

## 2023-09-27 DIAGNOSIS — D1801 Hemangioma of skin and subcutaneous tissue: Secondary | ICD-10-CM | POA: Diagnosis not present

## 2023-09-27 DIAGNOSIS — Z85820 Personal history of malignant melanoma of skin: Secondary | ICD-10-CM | POA: Diagnosis not present

## 2023-09-27 DIAGNOSIS — Z85828 Personal history of other malignant neoplasm of skin: Secondary | ICD-10-CM | POA: Diagnosis not present

## 2023-09-27 DIAGNOSIS — L438 Other lichen planus: Secondary | ICD-10-CM | POA: Diagnosis not present

## 2023-09-29 ENCOUNTER — Ambulatory Visit (INDEPENDENT_AMBULATORY_CARE_PROVIDER_SITE_OTHER): Payer: PPO

## 2023-09-29 DIAGNOSIS — M2041 Other hammer toe(s) (acquired), right foot: Secondary | ICD-10-CM | POA: Diagnosis not present

## 2023-09-29 DIAGNOSIS — E1169 Type 2 diabetes mellitus with other specified complication: Secondary | ICD-10-CM

## 2023-09-29 DIAGNOSIS — B351 Tinea unguium: Secondary | ICD-10-CM

## 2023-09-29 DIAGNOSIS — L84 Corns and callosities: Secondary | ICD-10-CM

## 2023-09-29 DIAGNOSIS — M2042 Other hammer toe(s) (acquired), left foot: Secondary | ICD-10-CM | POA: Diagnosis not present

## 2023-09-29 NOTE — Progress Notes (Signed)

## 2023-10-07 DIAGNOSIS — Z1211 Encounter for screening for malignant neoplasm of colon: Secondary | ICD-10-CM | POA: Diagnosis not present

## 2023-10-07 DIAGNOSIS — E669 Obesity, unspecified: Secondary | ICD-10-CM | POA: Diagnosis not present

## 2023-10-07 DIAGNOSIS — E119 Type 2 diabetes mellitus without complications: Secondary | ICD-10-CM | POA: Diagnosis not present

## 2023-10-07 DIAGNOSIS — E785 Hyperlipidemia, unspecified: Secondary | ICD-10-CM | POA: Diagnosis not present

## 2023-10-07 DIAGNOSIS — E1169 Type 2 diabetes mellitus with other specified complication: Secondary | ICD-10-CM | POA: Diagnosis not present

## 2023-10-07 DIAGNOSIS — I10 Essential (primary) hypertension: Secondary | ICD-10-CM | POA: Diagnosis not present

## 2023-10-07 DIAGNOSIS — Z0000 Encounter for general adult medical examination without abnormal findings: Secondary | ICD-10-CM | POA: Diagnosis not present

## 2023-10-07 DIAGNOSIS — N200 Calculus of kidney: Secondary | ICD-10-CM | POA: Diagnosis not present

## 2023-10-07 DIAGNOSIS — Z1389 Encounter for screening for other disorder: Secondary | ICD-10-CM | POA: Diagnosis not present

## 2023-10-07 DIAGNOSIS — C4491 Basal cell carcinoma of skin, unspecified: Secondary | ICD-10-CM | POA: Diagnosis not present

## 2023-10-07 DIAGNOSIS — Z23 Encounter for immunization: Secondary | ICD-10-CM | POA: Diagnosis not present

## 2023-10-07 DIAGNOSIS — Z1231 Encounter for screening mammogram for malignant neoplasm of breast: Secondary | ICD-10-CM | POA: Diagnosis not present

## 2023-10-07 DIAGNOSIS — I83893 Varicose veins of bilateral lower extremities with other complications: Secondary | ICD-10-CM | POA: Diagnosis not present

## 2023-10-07 DIAGNOSIS — C4492 Squamous cell carcinoma of skin, unspecified: Secondary | ICD-10-CM | POA: Diagnosis not present

## 2023-11-02 DIAGNOSIS — Z85820 Personal history of malignant melanoma of skin: Secondary | ICD-10-CM | POA: Diagnosis not present

## 2023-11-02 DIAGNOSIS — Z85828 Personal history of other malignant neoplasm of skin: Secondary | ICD-10-CM | POA: Diagnosis not present

## 2023-11-02 DIAGNOSIS — C44319 Basal cell carcinoma of skin of other parts of face: Secondary | ICD-10-CM | POA: Diagnosis not present

## 2023-11-22 ENCOUNTER — Ambulatory Visit (INDEPENDENT_AMBULATORY_CARE_PROVIDER_SITE_OTHER): Payer: PPO | Admitting: Podiatry

## 2023-11-22 ENCOUNTER — Encounter: Payer: Self-pay | Admitting: Podiatry

## 2023-11-22 DIAGNOSIS — M79674 Pain in right toe(s): Secondary | ICD-10-CM

## 2023-11-22 DIAGNOSIS — E1169 Type 2 diabetes mellitus with other specified complication: Secondary | ICD-10-CM | POA: Diagnosis not present

## 2023-11-22 DIAGNOSIS — B351 Tinea unguium: Secondary | ICD-10-CM | POA: Diagnosis not present

## 2023-11-22 DIAGNOSIS — M79675 Pain in left toe(s): Secondary | ICD-10-CM

## 2023-11-22 NOTE — Progress Notes (Signed)
  Subjective:  Patient ID: [name], female    DOB: 05/19/1951,  MRN: [id number]  72 y.o. female presents preventative diabetic foot care and painful, discolored, thick toenails which interfere with daily activities. 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 Chief Complaint  Patient presents with   Diabetes    A1C 7.6 in October.   Foot Pain    DFC, last seen by Dr. Seth Bake in 10/24.    New problem(s): None   PCP is Lorenda Ishihara, MD.  No Known Allergies  Review of Systems: Negative except as noted in the HPI.   Objective:  [name] is a pleasant 72 y.o. female WD, WN in NAD. AAO x 3.  Vascular Examination: Vascular status intact b/l with palpable pedal pulses. CFT immediate b/l. Pedal hair present. No edema. No pain with calf compression b/l. Skin temperature gradient WNL b/l. No varicosities noted. No cyanosis or clubbing noted.  Neurological Examination: She has symptoms of diabetic neuropathy. Sensation grossly intact b/l with 10 gram monofilament. Vibratory sensation intact b/l.  Dermatological Examination: Pedal skin with normal turgor, texture and tone b/l. No open wounds nor interdigital macerations noted. Toenails 1-5 b/l thick, discolored, elongated with subungual debris and pain on dorsal palpation. No hyperkeratotic lesions noted b/l.   Musculoskeletal Examination: Muscle strength 5/5 to b/l LE.  Hammertoes 2-5 b/l.   Radiographs: None  Last A1c:       No data to display           Assessment:   1. Pain due to onychomycosis of toenails of both feet   2. Type 2 diabetes mellitus with other specified complication, without long-term current use of insulin (HCC)    Plan:  Patient was evaluated and treated. All patient's and/or POA's questions/concerns addressed on today's visit. Mycotic toenails 1-5 debrided in length and girth without  incident. Continue soft, supportive shoe gear daily. Report any pedal injuries to medical professional. Call office if there are any quesitons/concerns. -Continue foot and shoe inspections daily. Monitor blood glucose per PCP/Endocrinologist's recommendations. -Patient/POA to call should there be question/concern in the interim.  Return in about 3 months (around 02/20/2024).  Freddie Breech, DPM      [place] LOCATION: [address] [place] [address]                   Office [phone]   [facility] LOCATION: [address] [place], [place] [address] Office [phone]

## 2024-02-27 DIAGNOSIS — M5416 Radiculopathy, lumbar region: Secondary | ICD-10-CM | POA: Diagnosis not present

## 2024-02-27 DIAGNOSIS — M47896 Other spondylosis, lumbar region: Secondary | ICD-10-CM | POA: Diagnosis not present

## 2024-03-07 DIAGNOSIS — M5416 Radiculopathy, lumbar region: Secondary | ICD-10-CM | POA: Diagnosis not present

## 2024-03-20 ENCOUNTER — Encounter: Payer: Self-pay | Admitting: Podiatry

## 2024-03-20 ENCOUNTER — Ambulatory Visit (INDEPENDENT_AMBULATORY_CARE_PROVIDER_SITE_OTHER): Payer: PPO | Admitting: Podiatry

## 2024-03-20 VITALS — Ht 64.0 in | Wt 180.0 lb

## 2024-03-20 DIAGNOSIS — E1142 Type 2 diabetes mellitus with diabetic polyneuropathy: Secondary | ICD-10-CM | POA: Diagnosis not present

## 2024-03-20 DIAGNOSIS — M79675 Pain in left toe(s): Secondary | ICD-10-CM

## 2024-03-20 DIAGNOSIS — M2042 Other hammer toe(s) (acquired), left foot: Secondary | ICD-10-CM | POA: Diagnosis not present

## 2024-03-20 DIAGNOSIS — M79674 Pain in right toe(s): Secondary | ICD-10-CM

## 2024-03-20 DIAGNOSIS — M2041 Other hammer toe(s) (acquired), right foot: Secondary | ICD-10-CM

## 2024-03-20 DIAGNOSIS — B351 Tinea unguium: Secondary | ICD-10-CM | POA: Diagnosis not present

## 2024-03-20 NOTE — Progress Notes (Signed)
  Subjective:  Patient ID: [name], female    DOB: 21-Mar-1951,  MRN: [id number]  73 y.o. female presents preventative diabetic foot care and painful, elongated thickened toenails x 10 which are symptomatic when wearing enclosed shoe gear. This interferes with his/her daily activities. She is requesting new diabetic shoes on today's visit. Chief Complaint  Patient presents with   DFc    She is here for a diabetic nail trim, "I think my last A1C was around 7, PCP is Dr Daron Offer, seen in May and October,    New problem(s): None   PCP is Lorenda Ishihara, MD.  No Known Allergies  Review of Systems: Negative except as noted in the HPI.   Objective:  [name] is a pleasant 73 y.o. female WD, WN in NAD. AAO x 3.  Vascular Examination: Vascular status intact b/l with palpable pedal pulses. CFT immediate b/l. Pedal hair present. No edema. No pain with calf compression b/l. Skin temperature gradient WNL b/l. No varicosities noted. No cyanosis or clubbing noted.  Neurological Examination: She has symptoms of diabetic neuropathy. Sensation grossly intact b/l with 10 gram monofilament. Vibratory sensation intact b/l.  Dermatological Examination: Pedal skin with normal turgor, texture and tone b/l. No open wounds nor interdigital macerations noted. Toenails 1-5 b/l thick, discolored, elongated with subungual debris and pain on dorsal palpation. No hyperkeratotic lesions noted b/l.   Musculoskeletal Examination: Muscle strength 5/5 to b/l LE.  Hammertoes 2-5 b/l.   Radiographs: None  Last A1c:       No data to display           Assessment:   1. Pain due to onychomycosis of toenails of both feet   2. Acquired hammertoes of both feet   3. Diabetic peripheral neuropathy associated with type 2 diabetes mellitus (HCC)    Plan:   Orders Placed This Encounter  Procedures   For Home Use Only DME Diabetic Shoe    Dispense one pair extra depth shoes and 3 pair heat  moldable insoles.  -Consent given for treatment. Patient examined. All patient's and/or POA's questions/concerns addressed on today's visit. Toenails 1-5 debrided in length and girth without incident. Continue foot and shoe inspections daily. Monitor blood glucose per PCP/Endocrinologist's recommendations. Continue soft, supportive shoe gear daily. Report any pedal injuries to medical professional.  -Patient to schedule appointment with Pedorthist when Pedorthist's schedule is open to do so. Patient related understanding. -Patient to continue soft, supportive shoe gear daily. Order entered for one pair extra depth shoes and 3 pair heat moldable insoles. Patient qualifies based on diagnoses. -Patient/POA to call should there be question/concern in the interim.  Return in about 3 months (around 06/19/2024).  Freddie Breech, DPM      [place] LOCATION: [address], [place] [address]                   Office [phone]   [facility] LOCATION: [address] [place], [place] [address] Office [phone]

## 2024-03-29 DIAGNOSIS — M47816 Spondylosis without myelopathy or radiculopathy, lumbar region: Secondary | ICD-10-CM | POA: Diagnosis not present

## 2024-03-29 DIAGNOSIS — M5416 Radiculopathy, lumbar region: Secondary | ICD-10-CM | POA: Diagnosis not present

## 2024-04-17 DIAGNOSIS — L570 Actinic keratosis: Secondary | ICD-10-CM | POA: Diagnosis not present

## 2024-04-17 DIAGNOSIS — L821 Other seborrheic keratosis: Secondary | ICD-10-CM | POA: Diagnosis not present

## 2024-04-17 DIAGNOSIS — L814 Other melanin hyperpigmentation: Secondary | ICD-10-CM | POA: Diagnosis not present

## 2024-04-17 DIAGNOSIS — I788 Other diseases of capillaries: Secondary | ICD-10-CM | POA: Diagnosis not present

## 2024-04-17 DIAGNOSIS — D485 Neoplasm of uncertain behavior of skin: Secondary | ICD-10-CM | POA: Diagnosis not present

## 2024-04-17 DIAGNOSIS — Z85828 Personal history of other malignant neoplasm of skin: Secondary | ICD-10-CM | POA: Diagnosis not present

## 2024-04-17 DIAGNOSIS — C44619 Basal cell carcinoma of skin of left upper limb, including shoulder: Secondary | ICD-10-CM | POA: Diagnosis not present

## 2024-04-17 DIAGNOSIS — Z85820 Personal history of malignant melanoma of skin: Secondary | ICD-10-CM | POA: Diagnosis not present

## 2024-04-17 DIAGNOSIS — D1801 Hemangioma of skin and subcutaneous tissue: Secondary | ICD-10-CM | POA: Diagnosis not present

## 2024-04-27 DIAGNOSIS — E1169 Type 2 diabetes mellitus with other specified complication: Secondary | ICD-10-CM | POA: Diagnosis not present

## 2024-04-27 DIAGNOSIS — Z1211 Encounter for screening for malignant neoplasm of colon: Secondary | ICD-10-CM | POA: Diagnosis not present

## 2024-04-27 DIAGNOSIS — I10 Essential (primary) hypertension: Secondary | ICD-10-CM | POA: Diagnosis not present

## 2024-04-27 DIAGNOSIS — E785 Hyperlipidemia, unspecified: Secondary | ICD-10-CM | POA: Diagnosis not present

## 2024-05-07 DIAGNOSIS — M79641 Pain in right hand: Secondary | ICD-10-CM | POA: Diagnosis not present

## 2024-05-18 ENCOUNTER — Telehealth: Payer: Self-pay | Admitting: Podiatry

## 2024-05-18 NOTE — Telephone Encounter (Signed)
 Cancelled DSM appt. As of right now she does not want referral to [facility] she said she will wait until her 7/16 appt to speak with Dr. Denece Finger about it.

## 2024-05-21 DIAGNOSIS — M79641 Pain in right hand: Secondary | ICD-10-CM | POA: Diagnosis not present

## 2024-05-23 DIAGNOSIS — K648 Other hemorrhoids: Secondary | ICD-10-CM | POA: Diagnosis not present

## 2024-05-23 DIAGNOSIS — Z800 Family history of malignant neoplasm of digestive organs: Secondary | ICD-10-CM | POA: Diagnosis not present

## 2024-05-23 DIAGNOSIS — Z1211 Encounter for screening for malignant neoplasm of colon: Secondary | ICD-10-CM | POA: Diagnosis not present

## 2024-05-23 DIAGNOSIS — D123 Benign neoplasm of transverse colon: Secondary | ICD-10-CM | POA: Diagnosis not present

## 2024-05-25 DIAGNOSIS — D123 Benign neoplasm of transverse colon: Secondary | ICD-10-CM | POA: Diagnosis not present

## 2024-06-25 DIAGNOSIS — M79641 Pain in right hand: Secondary | ICD-10-CM | POA: Diagnosis not present

## 2024-07-04 ENCOUNTER — Other Ambulatory Visit

## 2024-07-04 ENCOUNTER — Encounter: Payer: Self-pay | Admitting: Podiatry

## 2024-07-04 ENCOUNTER — Ambulatory Visit (INDEPENDENT_AMBULATORY_CARE_PROVIDER_SITE_OTHER): Admitting: Podiatry

## 2024-07-04 DIAGNOSIS — M2041 Other hammer toe(s) (acquired), right foot: Secondary | ICD-10-CM

## 2024-07-04 DIAGNOSIS — M2042 Other hammer toe(s) (acquired), left foot: Secondary | ICD-10-CM | POA: Diagnosis not present

## 2024-07-04 DIAGNOSIS — M79674 Pain in right toe(s): Secondary | ICD-10-CM

## 2024-07-04 DIAGNOSIS — B351 Tinea unguium: Secondary | ICD-10-CM

## 2024-07-04 DIAGNOSIS — M79675 Pain in left toe(s): Secondary | ICD-10-CM | POA: Diagnosis not present

## 2024-07-04 DIAGNOSIS — E1142 Type 2 diabetes mellitus with diabetic polyneuropathy: Secondary | ICD-10-CM | POA: Diagnosis not present

## 2024-07-04 NOTE — Progress Notes (Signed)
 ANNUAL DIABETIC FOOT EXAM  Subjective: [name] presents today for annual diabetic foot exam. Chief Complaint  Patient presents with   [facility]    Rm16 Diabetic/A1 7.5 Dr Elliot April 2025   Patient confirms h/o diabetes.  Patient denies any h/o foot wounds.  Varadarajan, Rupashree, MD is patient's PCP.  Past Medical History:  Diagnosis Date   Diabetes mellitus without complication (HCC)    Hypertension    Patient Active Problem List   Diagnosis Date Noted   Varicose veins of bilateral lower extremities with other complications 06/17/2021   Dyslipidemia 12/03/2020   Essential hypertension 12/03/2020   Kidney stone 12/03/2020   Malignant melanoma (HCC) 12/03/2020   Obesity 12/03/2020   Other abnormal and inconclusive findings on diagnostic imaging of breast 12/03/2020   Skin sensation disturbance 12/03/2020   Skin cancer 12/03/2020   Type 2 diabetes mellitus with other specified complication (HCC) 12/03/2020   Past Surgical History:  Procedure Laterality Date   ABDOMINAL HYSTERECTOMY     BACK SURGERY     HERNIA REPAIR     JOINT REPLACEMENT     SKIN CANCER EXCISION     on the face and arms   Current Outpatient Medications on File Prior to Visit  Medication Sig Dispense Refill   augmented betamethasone dipropionate (DIPROLENE-AF) 0.05 % cream      Blood Glucose Monitoring Suppl (ONE TOUCH ULTRA 2) w/Device KIT U TO TEST BLOOD SUGAR UTD     cholecalciferol (VITAMIN D) 1000 units tablet Take 1,000 Units by mouth daily.     D 1000 25 MCG (1000 UT) capsule SMARTSIG:1 By Mouth     empagliflozin (JARDIANCE) 25 MG TABS tablet Take 25 mg by mouth daily.     fluorouracil (EFUDEX) 5 % cream Apply 1 a small amount to affected area twice a day     Lancet Devices (ONE TOUCH DELICA LANCING DEV) MISC See admin instructions.     Lancets (ONETOUCH DELICA PLUS LANCET30G) MISC USE AS DIRECTED USE FOR CHECKING BLOOD SUGARS ONCE DAILY     lisinopril-hydrochlorothiazide  (PRINZIDE,ZESTORETIC) 10-12.5 MG tablet Take 1 tablet by mouth daily.     meclizine (ANTIVERT) 12.5 MG tablet 1 tablet as needed     meloxicam (MOBIC) 7.5 MG tablet 1 tablet     metFORMIN (GLUCOPHAGE-XR) 500 MG 24 hr tablet Take 500 mg by mouth daily with breakfast. Take 4 tablets at one time during my evening meal; this equals 2000 mg every day     Multiple Vitamin (MULTIVITAMIN) tablet Take 1 tablet by mouth daily.     mupirocin ointment (BACTROBAN) 2 % Apply 1 application. topically daily.     ofloxacin (OCUFLOX) 0.3 % ophthalmic solution Place 1 drop into the right eye 4 (four) times daily.     ONE TOUCH ULTRA TEST test strip      prednisoLONE acetate (PRED FORTE) 1 % ophthalmic suspension Place 1 drop into the right eye 4 (four) times daily.     predniSONE (STERAPRED UNI-PAK 21 TAB) 10 MG (21) TBPK tablet TAKE AS DIRECTED FOR 6 DAYS     Probiotic, Lactobacillus, CAPS SMARTSIG:1 By Mouth     PROLENSA 0.07 % SOLN Place 1 drop into the right eye daily.     Semaglutide,0.25 or 0.5MG /DOS, (OZEMPIC, 0.25 OR 0.5 MG/DOSE,) 2 MG/1.5ML SOPN 0.25 mg x 4 weeks then 0.5 mg     simvastatin (ZOCOR) 10 MG tablet Take 10 mg by mouth daily.     triamcinolone (KENALOG) 0.1 %  Apply 1 application topically 2 (two) times daily as needed.     valACYclovir (VALTREX) 1000 MG tablet TK 1 T PO TID FOR 1 WK UTD     vitamin B-12 (CYANOCOBALAMIN) 100 MCG tablet Take 100 mcg by mouth daily.     No current facility-administered medications on file prior to visit.    No Known Allergies Social History   Occupational History   Not on file  Tobacco Use   Smoking status: Never   Smokeless tobacco: Never  Substance and Sexual Activity   Alcohol use: No   Drug use: No   Sexual activity: Not on file   History reviewed. No pertinent family history. Immunization History  Administered Date(s) Administered   Influenza Split 12/06/2015, 09/18/2016   PFIZER(Purple Top)SARS-COV-2 Vaccination 02/18/2020, 03/10/2020    PNEUMOCOCCAL CONJUGATE-20 10/07/2023   Pneumococcal Conjugate-13 08/21/2015   Pneumococcal Polysaccharide-23 10/27/2016   Tdap 06/26/2016, 03/24/2023     Review of Systems: Negative except as noted in the HPI.   Objective: There were no vitals filed for this visit.  [name] is a pleasant 73 y.o. female in NAD. AAO X 3.    Lab Results  Component Value Date   HGBA1C (H) 01/12/2011    7.3 (NOTE)                                                                       According to the ADA Clinical Practice Recommendations for 2011, when HbA1c is used as a screening test:   >=6.5%   Diagnostic of Diabetes Mellitus           (if abnormal result  is confirmed)  5.7-6.4%   Increased risk of developing Diabetes Mellitus  References:Diagnosis and Classification of Diabetes Mellitus,Diabetes Care,2011,34(Suppl 1):S62-S69 and Standards of Medical Care in         Diabetes - 2011,Diabetes Care,2011,34  (Suppl 1):S11-S61.   ADA Risk Categorization: Low Risk :  Patient has all of the following: Intact protective sensation No prior foot ulcer  No severe deformity Pedal pulses present  Assessment: 1. Pain due to onychomycosis of toenails of both feet   2. Acquired hammertoes of both feet   3. Diabetic peripheral neuropathy associated with type 2 diabetes mellitus (HCC)      Plan: Orders Placed This Encounter  Procedures   For home use only DME Other see comment    To [facility]:  Dispense one pair extra depth shoes and 3 pair total contact insoles.    Length of Need:   Lifetime   -Patient was evaluated today. All questions/concerns addressed on today's visit. -Continue foot and shoe inspections daily. Monitor blood glucose per PCP/Endocrinologist's recommendations. -Patient to continue soft, supportive shoe gear daily. -Rx to be sent to [facility] dispensed to patient for one pair diabetic shoes and 3 pair total contact  insoles. -Mycotic toenails 1-5 bilaterally were debrided in length and girth with sterile nail nippers and dremel without incident. Treatment was provided by assistant Mable FABIENE Cherry under my supervision.  -Patient/POA to call should there be question/concern in the interim. Return in about 3 months (around 10/04/2024).  Delon LITTIE Merlin, DPM      [place] LOCATION: [address] [place] [address]                   Office [phone]   [facility] LOCATION: [address] [place], [place] [address] Office [phone]

## 2024-07-06 DIAGNOSIS — E1165 Type 2 diabetes mellitus with hyperglycemia: Secondary | ICD-10-CM | POA: Diagnosis not present

## 2024-07-06 DIAGNOSIS — L259 Unspecified contact dermatitis, unspecified cause: Secondary | ICD-10-CM | POA: Diagnosis not present

## 2024-10-17 ENCOUNTER — Ambulatory Visit: Admitting: Podiatry

## 2024-10-17 ENCOUNTER — Encounter: Payer: Self-pay | Admitting: Podiatry

## 2024-10-17 DIAGNOSIS — E1142 Type 2 diabetes mellitus with diabetic polyneuropathy: Secondary | ICD-10-CM | POA: Diagnosis not present

## 2024-10-17 DIAGNOSIS — B351 Tinea unguium: Secondary | ICD-10-CM | POA: Diagnosis not present

## 2024-10-17 DIAGNOSIS — M79674 Pain in right toe(s): Secondary | ICD-10-CM

## 2024-10-17 DIAGNOSIS — M79675 Pain in left toe(s): Secondary | ICD-10-CM

## 2024-10-18 DIAGNOSIS — D1801 Hemangioma of skin and subcutaneous tissue: Secondary | ICD-10-CM | POA: Diagnosis not present

## 2024-10-18 DIAGNOSIS — D0472 Carcinoma in situ of skin of left lower limb, including hip: Secondary | ICD-10-CM | POA: Diagnosis not present

## 2024-10-18 DIAGNOSIS — L821 Other seborrheic keratosis: Secondary | ICD-10-CM | POA: Diagnosis not present

## 2024-10-18 DIAGNOSIS — Z85820 Personal history of malignant melanoma of skin: Secondary | ICD-10-CM | POA: Diagnosis not present

## 2024-10-18 DIAGNOSIS — C44319 Basal cell carcinoma of skin of other parts of face: Secondary | ICD-10-CM | POA: Diagnosis not present

## 2024-10-18 DIAGNOSIS — L814 Other melanin hyperpigmentation: Secondary | ICD-10-CM | POA: Diagnosis not present

## 2024-10-18 DIAGNOSIS — L718 Other rosacea: Secondary | ICD-10-CM | POA: Diagnosis not present

## 2024-10-18 DIAGNOSIS — C4372 Malignant melanoma of left lower limb, including hip: Secondary | ICD-10-CM | POA: Diagnosis not present

## 2024-10-18 DIAGNOSIS — L570 Actinic keratosis: Secondary | ICD-10-CM | POA: Diagnosis not present

## 2024-10-18 DIAGNOSIS — D485 Neoplasm of uncertain behavior of skin: Secondary | ICD-10-CM | POA: Diagnosis not present

## 2024-10-18 DIAGNOSIS — L820 Inflamed seborrheic keratosis: Secondary | ICD-10-CM | POA: Diagnosis not present

## 2024-10-18 DIAGNOSIS — Z85828 Personal history of other malignant neoplasm of skin: Secondary | ICD-10-CM | POA: Diagnosis not present

## 2024-10-21 NOTE — Progress Notes (Signed)
  Subjective:  Patient ID: [name], female    DOB: July 11, 1951,  MRN: [id number]  [name] presents to clinic today for at risk foot care with history of diabetic neuropathy and painful mycotic toenails of both feet that are difficult to trim. Pain interferes with daily activities and wearing enclosed shoe gear comfortably.  Chief Complaint  Patient presents with   Diabetes    DFC NIDDM A1C 7. Toenail trim. LOV with PCP 04/27/24.   New problem(s): None.   PCP is Elliot Charm, MD.  No Known Allergies  Review of Systems: Negative except as noted in the HPI.  Objective: No changes noted in today's physical examination. There were no vitals filed for this visit. [name] is a pleasant 73 y.o. female in NAD. AAO x 3.  Vascular Examination: Capillary refill time immediate b/l. Palpable pedal pulses. Pedal hair present b/l. Pedal edema absent. No pain with calf compression b/l. Skin temperature gradient WNL b/l. No cyanosis or clubbing b/l. No ischemia or gangrene noted b/l.   Neurological Examination: Sensation grossly intact b/l with 10 gram monofilament. Vibratory sensation intact b/l. Pt has subjective symptoms of neuropathy.  Dermatological Examination: Pedal skin with normal turgor, texture and tone b/l.  No open wounds. No interdigital macerations.   Toenails 1-5 b/l thick, discolored, elongated with subungual debris and pain on dorsal palpation.   No corns, calluses, nor porokeratotic lesions.  Musculoskeletal Examination: Muscle strength 5/5 to all lower extremity muscle groups bilaterally. Hammertoe(s) 2-5 b/l.Michelle No pain, crepitus or joint limitation noted with ROM b/l LE.  Patient ambulates independently without assistive aids.  Radiographs: None  Assessment/Plan: 1. Pain due to onychomycosis of toenails of both feet   2. Diabetic peripheral neuropathy associated with type 2 diabetes mellitus (HCC)   Patient was evaluated and treated. All  patient's and/or POA's questions/concerns addressed on today's visit. Mycotic toenails 1-5 b/l debrided in length and girth without incident.  Continue daily foot inspections and monitor blood glucose per PCP/Endocrinologist's recommendations.Continue soft, supportive shoe gear daily. Report any pedal injuries to medical professional. Call office if there are any quesitons/concerns. -Patient/POA to call should there be question/concern in the interim.   Return in about 3 months (around 01/17/2025).  Delon LITTIE Hansen, DPM      [place] LOCATION: [address] [place] [address]                   Office [phone]   [facility] LOCATION: [address] [place], [place] [address] Office [phone]

## 2024-10-25 DIAGNOSIS — Z0000 Encounter for general adult medical examination without abnormal findings: Secondary | ICD-10-CM | POA: Diagnosis not present

## 2024-10-25 DIAGNOSIS — Z7189 Other specified counseling: Secondary | ICD-10-CM | POA: Diagnosis not present

## 2024-10-25 DIAGNOSIS — Z23 Encounter for immunization: Secondary | ICD-10-CM | POA: Diagnosis not present

## 2024-10-25 DIAGNOSIS — C4492 Squamous cell carcinoma of skin, unspecified: Secondary | ICD-10-CM | POA: Diagnosis not present

## 2024-10-25 DIAGNOSIS — Z1331 Encounter for screening for depression: Secondary | ICD-10-CM | POA: Diagnosis not present

## 2024-10-25 DIAGNOSIS — I10 Essential (primary) hypertension: Secondary | ICD-10-CM | POA: Diagnosis not present

## 2024-10-25 DIAGNOSIS — Z136 Encounter for screening for cardiovascular disorders: Secondary | ICD-10-CM | POA: Diagnosis not present

## 2024-10-25 DIAGNOSIS — E1169 Type 2 diabetes mellitus with other specified complication: Secondary | ICD-10-CM | POA: Diagnosis not present

## 2024-10-31 DIAGNOSIS — Z85828 Personal history of other malignant neoplasm of skin: Secondary | ICD-10-CM | POA: Diagnosis not present

## 2024-10-31 DIAGNOSIS — Z85820 Personal history of malignant melanoma of skin: Secondary | ICD-10-CM | POA: Diagnosis not present

## 2024-10-31 DIAGNOSIS — C4372 Malignant melanoma of left lower limb, including hip: Secondary | ICD-10-CM | POA: Diagnosis not present

## 2024-11-28 DIAGNOSIS — Z85828 Personal history of other malignant neoplasm of skin: Secondary | ICD-10-CM | POA: Diagnosis not present

## 2024-11-28 DIAGNOSIS — Z85820 Personal history of malignant melanoma of skin: Secondary | ICD-10-CM | POA: Diagnosis not present

## 2024-11-28 DIAGNOSIS — C44319 Basal cell carcinoma of skin of other parts of face: Secondary | ICD-10-CM | POA: Diagnosis not present

## 2025-01-30 ENCOUNTER — Ambulatory Visit: Admitting: Podiatry
